# Patient Record
Sex: Male | Born: 1970 | Race: White | Hispanic: No | Marital: Married | Smoking: Never smoker
Health system: Southern US, Community
[De-identification: ages and names within clinical notes are randomized; demographics above are authoritative.]

## PROBLEM LIST (undated history)

## (undated) DIAGNOSIS — K219 Gastro-esophageal reflux disease without esophagitis: Secondary | ICD-10-CM

## (undated) DIAGNOSIS — Z91018 Allergy to other foods: Secondary | ICD-10-CM

## (undated) DIAGNOSIS — I10 Essential (primary) hypertension: Secondary | ICD-10-CM

## (undated) DIAGNOSIS — E785 Hyperlipidemia, unspecified: Secondary | ICD-10-CM

## (undated) DIAGNOSIS — K760 Fatty (change of) liver, not elsewhere classified: Secondary | ICD-10-CM

## (undated) DIAGNOSIS — K602 Anal fissure, unspecified: Secondary | ICD-10-CM

## (undated) DIAGNOSIS — N200 Calculus of kidney: Secondary | ICD-10-CM

## (undated) HISTORY — DX: Gastro-esophageal reflux disease without esophagitis: K21.9

## (undated) HISTORY — PX: OTHER SURGICAL HISTORY: SHX169

## (undated) HISTORY — DX: Allergy to other foods: Z91.018

## (undated) HISTORY — DX: Morbid (severe) obesity due to excess calories: E66.01

## (undated) HISTORY — PX: CLOSED REDUCTION WRIST FRACTURE: SHX1091

## (undated) HISTORY — DX: Fatty (change of) liver, not elsewhere classified: K76.0

## (undated) HISTORY — DX: Anal fissure, unspecified: K60.2

## (undated) HISTORY — DX: Essential (primary) hypertension: I10

## (undated) HISTORY — DX: Hyperlipidemia, unspecified: E78.5

## (undated) HISTORY — DX: Calculus of kidney: N20.0

---

## 1989-06-01 HISTORY — PX: TONSILLECTOMY: SUR1361

## 2005-10-31 ENCOUNTER — Encounter (INDEPENDENT_AMBULATORY_CARE_PROVIDER_SITE_OTHER): Payer: Self-pay | Admitting: *Deleted

## 2006-07-28 ENCOUNTER — Encounter: Payer: Self-pay | Admitting: Cardiology

## 2008-06-07 ENCOUNTER — Ambulatory Visit: Payer: Self-pay | Admitting: *Deleted

## 2008-06-07 ENCOUNTER — Encounter: Payer: Self-pay | Admitting: Internal Medicine

## 2008-06-07 DIAGNOSIS — I10 Essential (primary) hypertension: Secondary | ICD-10-CM | POA: Insufficient documentation

## 2008-06-07 DIAGNOSIS — N200 Calculus of kidney: Secondary | ICD-10-CM | POA: Insufficient documentation

## 2008-06-07 DIAGNOSIS — B356 Tinea cruris: Secondary | ICD-10-CM | POA: Insufficient documentation

## 2008-06-07 DIAGNOSIS — K219 Gastro-esophageal reflux disease without esophagitis: Secondary | ICD-10-CM | POA: Insufficient documentation

## 2008-06-07 DIAGNOSIS — E669 Obesity, unspecified: Secondary | ICD-10-CM | POA: Insufficient documentation

## 2008-06-07 LAB — CONVERTED CEMR LAB
ALT: 41 units/L (ref 0–53)
AST: 31 units/L (ref 0–37)
Albumin: 4.3 g/dL (ref 3.5–5.2)
Alkaline Phosphatase: 68 units/L (ref 39–117)
BUN: 13 mg/dL (ref 6–23)
Basophils Absolute: 0 10*3/uL (ref 0.0–0.1)
Basophils Relative: 0.6 % (ref 0.0–3.0)
CO2: 30 meq/L (ref 19–32)
Calcium: 9.4 mg/dL (ref 8.4–10.5)
Chloride: 101 meq/L (ref 96–112)
Cholesterol: 176 mg/dL (ref 0–200)
Creatinine, Ser: 0.9 mg/dL (ref 0.4–1.5)
Eosinophils Absolute: 0.1 10*3/uL (ref 0.0–0.7)
Eosinophils Relative: 1.1 % (ref 0.0–5.0)
GFR calc Af Amer: 121 mL/min
GFR calc non Af Amer: 100 mL/min
Glucose, Bld: 93 mg/dL (ref 70–99)
HCT: 47.1 % (ref 39.0–52.0)
HDL: 34 mg/dL — ABNORMAL LOW (ref >39.0)
Hemoglobin: 16.1 g/dL (ref 13.0–17.0)
LDL Cholesterol: 118 mg/dL — ABNORMAL HIGH (ref 0–99)
Lymphocytes Relative: 30.7 % (ref 12.0–46.0)
MCHC: 34.1 g/dL (ref 30.0–36.0)
MCV: 86.4 fL (ref 78.0–100.0)
Monocytes Absolute: 0.4 10*3/uL (ref 0.1–1.0)
Monocytes Relative: 5.9 % (ref 3.0–12.0)
Neutro Abs: 4.1 10*3/uL (ref 1.4–7.7)
Neutrophils Relative %: 61.7 % (ref 43.0–77.0)
Platelets: 226 10*3/uL (ref 150–400)
Potassium: 3.8 meq/L (ref 3.5–5.1)
RBC: 5.45 M/uL (ref 4.22–5.81)
RDW: 13.2 % (ref 11.5–14.6)
Sodium: 138 meq/L (ref 135–145)
TSH: 1.54 microintl units/mL (ref 0.35–5.50)
Total Bilirubin: 1.1 mg/dL (ref 0.3–1.2)
Total CHOL/HDL Ratio: 5.2
Total Protein: 8 g/dL (ref 6.0–8.3)
Triglycerides: 118 mg/dL (ref 0–149)
VLDL: 24 mg/dL (ref 0–40)
WBC: 6.7 10*3/uL (ref 4.5–10.5)

## 2008-07-11 ENCOUNTER — Ambulatory Visit: Payer: Self-pay | Admitting: *Deleted

## 2008-07-11 DIAGNOSIS — E785 Hyperlipidemia, unspecified: Secondary | ICD-10-CM | POA: Insufficient documentation

## 2008-07-11 DIAGNOSIS — E876 Hypokalemia: Secondary | ICD-10-CM | POA: Insufficient documentation

## 2008-07-11 LAB — CONVERTED CEMR LAB
BUN: 18 mg/dL (ref 6–23)
CO2: 31 meq/L (ref 19–32)
Calcium: 9.6 mg/dL (ref 8.4–10.5)
Chloride: 104 meq/L (ref 96–112)
Creatinine, Ser: 1.2 mg/dL (ref 0.4–1.5)
GFR calc Af Amer: 87 mL/min
GFR calc non Af Amer: 72 mL/min
Glucose, Bld: 109 mg/dL — ABNORMAL HIGH (ref 70–99)
Potassium: 3.4 meq/L — ABNORMAL LOW (ref 3.5–5.1)
Sodium: 138 meq/L (ref 135–145)

## 2008-07-16 ENCOUNTER — Ambulatory Visit: Payer: Self-pay | Admitting: *Deleted

## 2008-07-16 DIAGNOSIS — E739 Lactose intolerance, unspecified: Secondary | ICD-10-CM | POA: Insufficient documentation

## 2008-07-16 LAB — CONVERTED CEMR LAB
Bilirubin Urine: NEGATIVE
Blood in Urine, dipstick: NEGATIVE
Glucose, Urine, Semiquant: NEGATIVE
Ketones, urine, test strip: NEGATIVE
Nitrite: NEGATIVE
Protein, U semiquant: NEGATIVE
Specific Gravity, Urine: <1.005
Urobilinogen, UA: 0.2
WBC Urine, dipstick: NEGATIVE
pH: 6

## 2008-09-14 ENCOUNTER — Ambulatory Visit: Payer: Self-pay | Admitting: Internal Medicine

## 2008-09-14 DIAGNOSIS — J069 Acute upper respiratory infection, unspecified: Secondary | ICD-10-CM | POA: Insufficient documentation

## 2008-12-28 ENCOUNTER — Ambulatory Visit: Payer: Self-pay | Admitting: Internal Medicine

## 2008-12-31 ENCOUNTER — Telehealth: Payer: Self-pay | Admitting: Internal Medicine

## 2008-12-31 ENCOUNTER — Ambulatory Visit: Payer: Self-pay | Admitting: Internal Medicine

## 2008-12-31 DIAGNOSIS — K602 Anal fissure, unspecified: Secondary | ICD-10-CM | POA: Insufficient documentation

## 2008-12-31 HISTORY — DX: Anal fissure, unspecified: K60.2

## 2009-01-23 LAB — CONVERTED CEMR LAB
BUN: 18 mg/dL (ref 6–23)
CO2: 24 meq/L (ref 19–32)
Calcium: 9 mg/dL (ref 8.4–10.5)
Chloride: 106 meq/L (ref 96–112)
Creatinine, Ser: 0.95 mg/dL (ref 0.40–1.50)
Glucose, Bld: 93 mg/dL (ref 70–99)
Hgb A1c MFr Bld: 5.1 % (ref 4.6–6.1)
Potassium: 4.1 meq/L (ref 3.5–5.3)
Sodium: 141 meq/L (ref 135–145)

## 2009-03-24 ENCOUNTER — Emergency Department (HOSPITAL_COMMUNITY): Admission: EM | Admit: 2009-03-24 | Discharge: 2009-03-24 | Payer: Self-pay | Admitting: Emergency Medicine

## 2009-03-25 ENCOUNTER — Ambulatory Visit: Payer: Self-pay | Admitting: Internal Medicine

## 2009-03-25 LAB — CONVERTED CEMR LAB
Free T4: 1.06 ng/dL (ref 0.80–1.80)
TSH: 1.432 microintl units/mL (ref 0.350–4.500)

## 2009-03-26 ENCOUNTER — Telehealth: Payer: Self-pay | Admitting: Internal Medicine

## 2009-04-17 ENCOUNTER — Encounter: Payer: Self-pay | Admitting: Cardiology

## 2009-04-17 ENCOUNTER — Ambulatory Visit: Payer: Self-pay | Admitting: Cardiology

## 2009-05-03 ENCOUNTER — Ambulatory Visit: Payer: Self-pay | Admitting: Family

## 2009-05-03 ENCOUNTER — Telehealth: Payer: Self-pay | Admitting: Family

## 2009-05-03 ENCOUNTER — Encounter: Payer: Self-pay | Admitting: Internal Medicine

## 2009-05-03 DIAGNOSIS — M545 Low back pain, unspecified: Secondary | ICD-10-CM | POA: Insufficient documentation

## 2009-05-03 LAB — CONVERTED CEMR LAB
Basophils Absolute: 0 10*3/uL (ref 0.0–0.1)
Basophils Relative: 1 % (ref 0–1)
Bilirubin Urine: NEGATIVE
Blood in Urine, dipstick: NEGATIVE
Eosinophils Absolute: 0.1 10*3/uL (ref 0.0–0.7)
Eosinophils Relative: 1 % (ref 0–5)
Glucose, Urine, Semiquant: NEGATIVE
HCT: 45.8 % (ref 39.0–52.0)
Hemoglobin: 15.8 g/dL (ref 13.0–17.0)
Ketones, urine, test strip: NEGATIVE
Lymphocytes Relative: 33 % (ref 12–46)
Lymphs Abs: 2.7 10*3/uL (ref 0.7–4.0)
MCHC: 34.5 g/dL (ref 30.0–36.0)
MCV: 83.9 fL (ref 78.0–100.0)
Monocytes Absolute: 0.6 10*3/uL (ref 0.1–1.0)
Monocytes Relative: 8 % (ref 3–12)
Neutro Abs: 4.8 10*3/uL (ref 1.7–7.7)
Neutrophils Relative %: 58 % (ref 43–77)
Nitrite: NEGATIVE
Platelets: 254 10*3/uL (ref 150–400)
RBC: 5.46 M/uL (ref 4.22–5.81)
RDW: 13.4 % (ref 11.5–15.5)
Specific Gravity, Urine: >=1.03
Urobilinogen, UA: 0.2
WBC Urine, dipstick: NEGATIVE
WBC: 8.2 10*3/uL (ref 4.0–10.5)
pH: 5

## 2009-05-09 ENCOUNTER — Ambulatory Visit: Payer: Self-pay | Admitting: Internal Medicine

## 2009-08-04 ENCOUNTER — Emergency Department (HOSPITAL_COMMUNITY): Admission: EM | Admit: 2009-08-04 | Discharge: 2009-08-04 | Payer: Self-pay | Admitting: Family Medicine

## 2009-08-07 ENCOUNTER — Encounter (INDEPENDENT_AMBULATORY_CARE_PROVIDER_SITE_OTHER): Payer: Self-pay | Admitting: *Deleted

## 2009-08-07 ENCOUNTER — Ambulatory Visit: Payer: Self-pay | Admitting: Internal Medicine

## 2009-08-07 DIAGNOSIS — J189 Pneumonia, unspecified organism: Secondary | ICD-10-CM | POA: Insufficient documentation

## 2009-08-14 ENCOUNTER — Ambulatory Visit: Payer: Self-pay | Admitting: Internal Medicine

## 2009-08-14 DIAGNOSIS — J329 Chronic sinusitis, unspecified: Secondary | ICD-10-CM | POA: Insufficient documentation

## 2009-08-14 LAB — CONVERTED CEMR LAB
ALT: 58 units/L — ABNORMAL HIGH (ref 0–53)
AST: 28 units/L (ref 0–37)
Albumin: 3.6 g/dL (ref 3.5–5.2)
Alkaline Phosphatase: 57 units/L (ref 39–117)
BUN: 13 mg/dL (ref 6–23)
Basophils Absolute: 0.1 10*3/uL (ref 0.0–0.1)
Basophils Relative: 1.1 % (ref 0.0–3.0)
Bilirubin Urine: NEGATIVE
Bilirubin, Direct: 0.2 mg/dL (ref 0.0–0.3)
CO2: 33 meq/L — ABNORMAL HIGH (ref 19–32)
Calcium: 8.6 mg/dL (ref 8.4–10.5)
Chloride: 102 meq/L (ref 96–112)
Cholesterol: 152 mg/dL (ref 0–200)
Creatinine, Ser: 1 mg/dL (ref 0.4–1.5)
Eosinophils Absolute: 0 10*3/uL (ref 0.0–0.7)
Eosinophils Relative: 0.7 % (ref 0.0–5.0)
GFR calc non Af Amer: 87.56 mL/min (ref >60)
Glucose, Bld: 91 mg/dL (ref 70–99)
HCT: 44 % (ref 39.0–52.0)
HDL: 37.9 mg/dL — ABNORMAL LOW (ref >39.00)
Hemoglobin: 14.9 g/dL (ref 13.0–17.0)
Ketones, ur: NEGATIVE mg/dL
LDL Cholesterol: 83 mg/dL (ref 0–99)
Leukocytes, UA: NEGATIVE
Lymphocytes Relative: 45.1 % (ref 12.0–46.0)
Lymphs Abs: 3.1 10*3/uL (ref 0.7–4.0)
MCHC: 33.9 g/dL (ref 30.0–36.0)
MCV: 86.2 fL (ref 78.0–100.0)
Monocytes Absolute: 0.6 10*3/uL (ref 0.1–1.0)
Monocytes Relative: 9 % (ref 3.0–12.0)
Neutro Abs: 3 10*3/uL (ref 1.4–7.7)
Neutrophils Relative %: 44.1 % (ref 43.0–77.0)
Nitrite: NEGATIVE
PSA: 0.54 ng/mL (ref 0.10–4.00)
Platelets: 275 10*3/uL (ref 150.0–400.0)
Potassium: 3.3 meq/L — ABNORMAL LOW (ref 3.5–5.1)
RBC: 5.11 M/uL (ref 4.22–5.81)
RDW: 13.8 % (ref 11.5–14.6)
Sodium: 139 meq/L (ref 135–145)
Specific Gravity, Urine: 1.025 (ref 1.000–1.030)
TSH: 1.24 microintl units/mL (ref 0.35–5.50)
Total Bilirubin: 0.9 mg/dL (ref 0.3–1.2)
Total CHOL/HDL Ratio: 4
Total Protein, Urine: NEGATIVE mg/dL
Total Protein: 6.8 g/dL (ref 6.0–8.3)
Triglycerides: 154 mg/dL — ABNORMAL HIGH (ref 0.0–149.0)
Urine Glucose: NEGATIVE mg/dL
Urobilinogen, UA: 0.2 (ref 0.0–1.0)
VLDL: 30.8 mg/dL (ref 0.0–40.0)
WBC: 6.8 10*3/uL (ref 4.5–10.5)
pH: 6.5 (ref 5.0–8.0)

## 2009-08-19 ENCOUNTER — Ambulatory Visit: Payer: Self-pay | Admitting: Internal Medicine

## 2009-09-27 ENCOUNTER — Ambulatory Visit: Payer: Self-pay | Admitting: Internal Medicine

## 2009-09-30 ENCOUNTER — Encounter (INDEPENDENT_AMBULATORY_CARE_PROVIDER_SITE_OTHER): Payer: Self-pay | Admitting: *Deleted

## 2009-09-30 ENCOUNTER — Telehealth: Payer: Self-pay | Admitting: Internal Medicine

## 2009-09-30 DIAGNOSIS — G4710 Hypersomnia, unspecified: Secondary | ICD-10-CM | POA: Insufficient documentation

## 2009-10-14 ENCOUNTER — Ambulatory Visit: Payer: Self-pay | Admitting: Pulmonary Disease

## 2009-10-14 DIAGNOSIS — G2581 Restless legs syndrome: Secondary | ICD-10-CM | POA: Insufficient documentation

## 2009-10-23 ENCOUNTER — Telehealth: Payer: Self-pay | Admitting: Internal Medicine

## 2009-10-25 ENCOUNTER — Encounter: Payer: Self-pay | Admitting: Internal Medicine

## 2009-11-18 ENCOUNTER — Ambulatory Visit (HOSPITAL_COMMUNITY): Admission: RE | Admit: 2009-11-18 | Discharge: 2009-11-18 | Payer: Self-pay | Admitting: Surgery

## 2009-11-18 ENCOUNTER — Ambulatory Visit: Payer: Self-pay | Admitting: Internal Medicine

## 2009-11-19 ENCOUNTER — Ambulatory Visit (HOSPITAL_BASED_OUTPATIENT_CLINIC_OR_DEPARTMENT_OTHER): Admission: RE | Admit: 2009-11-19 | Discharge: 2009-11-19 | Payer: Self-pay | Admitting: Pulmonary Disease

## 2009-11-19 ENCOUNTER — Ambulatory Visit: Payer: Self-pay | Admitting: Pulmonary Disease

## 2009-11-20 ENCOUNTER — Ambulatory Visit (HOSPITAL_COMMUNITY): Admission: RE | Admit: 2009-11-20 | Discharge: 2009-11-20 | Payer: Self-pay | Admitting: Surgery

## 2009-11-25 ENCOUNTER — Encounter: Payer: Self-pay | Admitting: Internal Medicine

## 2009-12-17 ENCOUNTER — Encounter: Admission: RE | Admit: 2009-12-17 | Discharge: 2010-03-17 | Payer: Self-pay | Admitting: Surgery

## 2010-01-02 ENCOUNTER — Telehealth: Payer: Self-pay | Admitting: Pulmonary Disease

## 2010-01-28 ENCOUNTER — Ambulatory Visit: Payer: Self-pay | Admitting: Internal Medicine

## 2010-01-28 LAB — CONVERTED CEMR LAB
Bilirubin Urine: NEGATIVE
Glucose, Urine, Semiquant: NEGATIVE
Ketones, urine, test strip: NEGATIVE
Nitrite: NEGATIVE
Protein, U semiquant: NEGATIVE
Specific Gravity, Urine: 1.015
Urobilinogen, UA: 0.2
WBC Urine, dipstick: NEGATIVE
pH: 5

## 2010-02-07 ENCOUNTER — Encounter: Payer: Self-pay | Admitting: Internal Medicine

## 2010-02-07 ENCOUNTER — Ambulatory Visit (HOSPITAL_BASED_OUTPATIENT_CLINIC_OR_DEPARTMENT_OTHER): Admission: RE | Admit: 2010-02-07 | Discharge: 2010-02-07 | Payer: Self-pay | Admitting: Urology

## 2010-02-07 ENCOUNTER — Emergency Department (HOSPITAL_BASED_OUTPATIENT_CLINIC_OR_DEPARTMENT_OTHER): Admission: EM | Admit: 2010-02-07 | Discharge: 2010-02-07 | Payer: Self-pay | Admitting: Emergency Medicine

## 2010-02-07 ENCOUNTER — Ambulatory Visit: Payer: Self-pay | Admitting: Diagnostic Radiology

## 2010-02-13 ENCOUNTER — Ambulatory Visit (HOSPITAL_COMMUNITY): Admission: RE | Admit: 2010-02-13 | Discharge: 2010-02-13 | Payer: Self-pay | Admitting: Urology

## 2010-02-18 ENCOUNTER — Encounter: Payer: Self-pay | Admitting: Internal Medicine

## 2010-03-03 ENCOUNTER — Ambulatory Visit: Payer: Self-pay | Admitting: Internal Medicine

## 2010-03-06 ENCOUNTER — Encounter: Payer: Self-pay | Admitting: Internal Medicine

## 2010-03-14 ENCOUNTER — Telehealth (INDEPENDENT_AMBULATORY_CARE_PROVIDER_SITE_OTHER): Payer: Self-pay | Admitting: *Deleted

## 2010-03-14 ENCOUNTER — Telehealth: Payer: Self-pay | Admitting: Pulmonary Disease

## 2010-03-14 DIAGNOSIS — G4733 Obstructive sleep apnea (adult) (pediatric): Secondary | ICD-10-CM | POA: Insufficient documentation

## 2010-03-17 ENCOUNTER — Encounter: Payer: Self-pay | Admitting: Pulmonary Disease

## 2010-03-18 ENCOUNTER — Ambulatory Visit (HOSPITAL_COMMUNITY): Admission: RE | Admit: 2010-03-18 | Discharge: 2010-03-19 | Payer: Self-pay | Admitting: Surgery

## 2010-03-18 HISTORY — PX: LAPAROSCOPIC GASTRIC BANDING: SHX1100

## 2010-04-01 ENCOUNTER — Encounter
Admission: RE | Admit: 2010-04-01 | Discharge: 2010-06-30 | Payer: Self-pay | Source: Home / Self Care | Attending: Surgery | Admitting: Surgery

## 2010-04-01 ENCOUNTER — Telehealth (INDEPENDENT_AMBULATORY_CARE_PROVIDER_SITE_OTHER): Payer: Self-pay | Admitting: *Deleted

## 2010-04-09 ENCOUNTER — Encounter: Payer: Self-pay | Admitting: Pulmonary Disease

## 2010-04-10 ENCOUNTER — Encounter: Payer: Self-pay | Admitting: Internal Medicine

## 2010-05-12 ENCOUNTER — Encounter
Admission: RE | Admit: 2010-05-12 | Discharge: 2010-07-01 | Payer: Self-pay | Source: Home / Self Care | Attending: Surgery | Admitting: Surgery

## 2010-05-12 ENCOUNTER — Telehealth: Payer: Self-pay | Admitting: Internal Medicine

## 2010-05-12 ENCOUNTER — Ambulatory Visit: Payer: Self-pay | Admitting: Internal Medicine

## 2010-05-15 ENCOUNTER — Encounter: Payer: Self-pay | Admitting: Internal Medicine

## 2010-06-18 ENCOUNTER — Encounter: Payer: Self-pay | Admitting: Internal Medicine

## 2010-06-27 ENCOUNTER — Emergency Department (HOSPITAL_BASED_OUTPATIENT_CLINIC_OR_DEPARTMENT_OTHER)
Admission: EM | Admit: 2010-06-27 | Discharge: 2010-06-27 | Payer: Self-pay | Source: Home / Self Care | Admitting: Emergency Medicine

## 2010-07-01 NOTE — Assessment & Plan Note (Signed)
Summary: [facility] PAIN/CD   Vital Signs:  Patient profile:   40 year old male Height:      64 inches (162.56 cm) Weight:      271.8 pounds (123.55 kg) O2 Sat:      95 % on Room air Temp:     99.9 degrees F (37.72 degrees C) oral Pulse rate:   111 / minute BP sitting:   142 / 98  (left arm) Cuff size:   large  Vitals Entered By: Orlan Leavens RMA (January 28, 2010 3:24 PM)  O2 Flow:  Room air CC: Flank pain & dark urine Is Patient Diabetic? No Pain Assessment Patient in pain? yes     Location: Flank pain Type: aching Comments Pt states 2 weeks ago started having (L) back pain ? kidney stone because he  states he felt it when it pass. As of yesterday urine has been very dark still having flank pain which pain goes around to his testicles.   Primary Care Provider:  Newt Lukes MD  CC:  Flank pain & dark urine.  History of Present Illness: c/o right flank pain onset 2 weeks ago - course has been progressive -  hx same related to kidney stone in past - has required lithotripsy and basket ret and JJ stent in past a/w dark cola colored urine - lighten and darken over the last 2 days - no gross hematuria or clots pain radiates into right testicle no dysuria but stop/start flow -   also f/u obesity -  s/p eval and clearance for bariatric surg EKG, barium swallow, nutritionist, [place] and sleep study all complete -- surg sched for 03/18/10  Current Medications (verified): 1)  Triamterene-Hctz 37.5-25 Mg Caps (Triamterene-Hctz) .... One Tab By Mouth Once Daily 2)  Nexium 40 Mg Cpdr (Esomeprazole Magnesium) .... One By Mouth Once Daily 30 Mins Before Meals 3)  Multivitamins  Tabs (Multiple Vitamin) .... Take 1 Tablet By Mouth Once A Day 4)  Fluticasone Propionate 50 Mcg/act Susp (Fluticasone Propionate) .Marland Kitchen.. 1 Spray Each Nostril  Every Morning 5)  Valacyclovir Hcl 1 Gm Tabs (Valacyclovir Hcl) .Marland Kitchen.. 1 By Mouth X 1 As Needed For Cold Sores - Repeat Daily If Needed 6)   Loratadine 10 Mg Tabs (Loratadine) .Marland Kitchen.. 1 By Mouth Once Daily  Allergies (verified): 1)  ! * Diaulad 2)  ! * Ivp Dye 3)  ! * Cats and Dogs 4)  ! * Shellfish 5)  ! * Peanuts  Past History:  Past Medical History: HYPERTENSION MORBID OBESITY GERD ANAL FISSURE  GLUCOSE INTOLERANCE, hx of NEPHROLITHIASIS, hx of  MD roster:   pulm - [facility] -   Review of Systems  The patient denies abdominal pain, incontinence, genital sores, abnormal bleeding, and enlarged lymph nodes.    Physical Exam  General:  alert, well-developed, well-nourished, and cooperative to examination. nontoxic, overweight-appearing.   Lungs:  normal respiratory effort, no intercostal retractions or use of accessory muscles; normal breath sounds bilaterally - no crackles and no wheezes.    Heart:  normal rate, regular rhythm, no murmur, and no rub. BLE without edema. Genitalia:  Testes bilaterally descended without nodularity, tenderness or masses. No scrotal masses or lesions. No penis lesions or urethral discharge.   Impression & Recommendations:  Problem # 1:  NEPHROLITHIASIS (ICD-592.0)  recurrent with hx same requiring intervention by uro surg - will refer for est with local uro now- treat with cipro awaiting Ucx given LGF - also pain control with  hydrocodone -  hydrate, strain  Orders: Urology Referral (Urology)  Problem # 2:  LOW BACK PAIN, ACUTE (ICD-724.2) see above - likely kidney stone, no neuro or mskel deficits His updated medication list for this problem includes:    Hydrocodone-acetaminophen 5-500 Mg Tabs (Hydrocodone-acetaminophen) .Marland Kitchen... 1 by mouth every 6 hours as needed for pain  Orders: UA Dipstick w/o Micro (manual) (16109) T-Culture, Urine (60454-09811)  Problem # 3:  MORBID OBESITY (ICD-278.01)  process towards lap band reviewed - sched 03/18/10 encouraged to continue exercise efforts (walking) as tolerated and diet   Ht: 64 (11/18/2009)   Wt: 267.4 (11/18/2009)   BMI: 43.41  (10/14/2009)  Ht: 64 (01/28/2010)   Wt: 271.8 (01/28/2010)   BMI: 43.41 (10/14/2009) abd [place] reviewed - +right kidney stones on 12/20/09  Complete Medication List: 1)  Triamterene-hctz 37.5-25 Mg Caps (Triamterene-hctz) .... One tab by mouth once daily 2)  Nexium 40 Mg Cpdr (Esomeprazole magnesium) .... One by mouth once daily 30 mins before meals 3)  Multivitamins Tabs (Multiple vitamin) .... Take 1 tablet by mouth once a day 4)  Fluticasone Propionate 50 Mcg/act Susp (Fluticasone propionate) .Marland Kitchen.. 1 spray each nostril  every morning 5)  Valacyclovir Hcl 1 Gm Tabs (Valacyclovir hcl) .Marland Kitchen.. 1 by mouth x 1 as needed for cold sores - repeat daily if needed 6)  Loratadine 10 Mg Tabs (Loratadine) .Marland Kitchen.. 1 by mouth once daily 7)  Ciprofloxacin Hcl 500 Mg Tabs (Ciprofloxacin hcl) .Marland Kitchen.. 1 by mouth two times a day x 7 days 8)  Hydrocodone-acetaminophen 5-500 Mg Tabs (Hydrocodone-acetaminophen) .Marland Kitchen.. 1 by mouth every 6 hours as needed for pain  Patient Instructions: 1)  it was good to see you today. 2)  cipro for possible infection and hydrocodone to help with kidney stone pain as discussed 3)  stay hydrated!! 4)  we'll make referral to urology for evaluation and treatment. Our office will contact you regarding this appointment once made.  5)  good luck with bariatric surgery as planned! 6)  Please schedule a follow-up appointment as needed. Prescriptions: HYDROCODONE-ACETAMINOPHEN 5-500 MG TABS (HYDROCODONE-ACETAMINOPHEN) 1 by mouth every 6 hours as needed for pain  #20 x 1   Entered and Authorized by:   Newt Lukes MD   Signed by:   Newt Lukes MD on 01/28/2010   Method used:   Print then Give to Patient   RxID:   [id number] CIPROFLOXACIN HCL 500 MG TABS (CIPROFLOXACIN HCL) 1 by mouth two times a day x 7 days  #14 x 0   Entered and Authorized by:   Newt Lukes MD   Signed by:   Newt Lukes MD on 01/28/2010   Method used:   Print then Give to Patient   RxID:    [id number]   Laboratory Results   Urine Tests    Routine Urinalysis   Color: brown/ light Appearance: Hazy Glucose: negative   (Normal Range: Negative) Bilirubin: negative   (Normal Range: Negative) Ketone: negative   (Normal Range: Negative) Spec. Gravity: 1.015   (Normal Range: 1.003-1.035) Blood: large   (Normal Range: Negative) pH: 5.0   (Normal Range: 5.0-8.0) Protein: negative   (Normal Range: Negative) Urobilinogen: 0.2   (Normal Range: 0-1) Nitrite: negative   (Normal Range: Negative) Leukocyte Esterace: negative   (Normal Range: Negative)

## 2010-07-01 NOTE — Progress Notes (Signed)
  Phone Note Outgoing Call   Call placed by: Coralyn Helling MD,  March 14, 2010 12:08 PM Call placed to: Patient Summary of Call: Contact patient about sleep test results.  Explained to him that he has severe OSA and needs to be set up with CPAP.  He was unable to follow up earlier because he was hospitalized with kidney stone.  He is also now set up for lap band surgery next week.  Will arrange for auto-CPAP for now.  Will have my nurse call him to schedule ROV in 2 months after CPAP set up.  Advised him to inform his surgeon and anesthesiologist that he has sleep apnea and that he needs to wear CPAP during the post-operative period after his lap band surgery. Initial call taken by: Coralyn Helling MD,  March 14, 2010 12:10 PM  New Problems: OBSTRUCTIVE SLEEP APNEA (ICD-327.23)   New Problems: OBSTRUCTIVE SLEEP APNEA (ICD-327.23)

## 2010-07-01 NOTE — Assessment & Plan Note (Signed)
Summary: DISCUSS LAP BAND SURGERY/#/CD   Vital Signs:  Patient profile:   40 year old male Height:      64 inches (670.56 cm) Weight:      264 pounds (44.91 kg) BMI:     45.48 O2 Sat:      95 % on Room air Temp:     98.8 degrees F (37.11 degrees C) oral Pulse rate:   105 / minute BP sitting:   130 / 86  (left arm) Cuff size:   large  Vitals Entered By: Orlan Leavens (September 27, 2009 3:44 PM)  O2 Flow:  Room air CC: Discuss lap band surgery Is Patient Diabetic? No Pain Assessment Patient in pain? no        Primary Care Provider:  Newt Lukes MD  CC:  Discuss lap band surgery.  History of Present Illness: obesity -  wants to discuss possible lap band has been to bariatric meeting and begun collection of paperwork and records to proceed with same  Clinical Review Panels:  Immunizations   Last Tetanus Booster:  Historical (01/29/2006)   Last Flu Vaccine:  Historical (03/04/2009)  Lipid Management   Cholesterol:  152 (08/14/2009)   LDL (bad choesterol):  83 (08/14/2009)   HDL (good cholesterol):  37.90 (08/14/2009)  CBC   WBC:  6.8 (08/14/2009)   RBC:  5.11 (08/14/2009)   Hgb:  14.9 (08/14/2009)   Hct:  44.0 (08/14/2009)   Platelets:  275.0 (08/14/2009)   MCV  86.2 (08/14/2009)   MCHC  33.9 (08/14/2009)   RDW  13.8 (08/14/2009)   PMN:  44.1 (08/14/2009)   Lymphs:  45.1 (08/14/2009)   Monos:  9.0 (08/14/2009)   Eosinophils:  0.7 (08/14/2009)   Basophil:  1.1 (08/14/2009)  Complete Metabolic Panel   Glucose:  91 (08/14/2009)   Sodium:  139 (08/14/2009)   Potassium:  3.3 (08/14/2009)   Chloride:  102 (08/14/2009)   CO2:  33 (08/14/2009)   BUN:  13 (08/14/2009)   Creatinine:  1.0 (08/14/2009)   Albumin:  3.6 (08/14/2009)   Total Protein:  6.8 (08/14/2009)   Calcium:  8.6 (08/14/2009)   Total Bili:  0.9 (08/14/2009)   Alk Phos:  57 (08/14/2009)   SGPT (ALT):  58 (08/14/2009)   SGOT (AST):  28 (08/14/2009)   Current Medications (verified): 1)   Triamterene-Hctz 37.5-25 Mg Caps (Triamterene-Hctz) .... One Tab By Mouth Once Daily 2)  Nexium 40 Mg Cpdr (Esomeprazole Magnesium) .... One By Mouth Once Daily 30 Mins Before Meals 3)  Multivitamins  Tabs (Multiple Vitamin) .... Take 1 Tablet By Mouth Once A Day 4)  Fluticasone Propionate 50 Mcg/act Susp (Fluticasone Propionate) .Marland Kitchen.. 1 Spray Each Nostril  Every Morning  Allergies (verified): 1)  ! * Diaulad 2)  ! * Ivp Dye 3)  ! * Cats and Dogs 4)  ! * Shellfish 5)  ! * Peanuts  Past History:  Past medical, surgical, family and social histories (including risk factors) reviewed, and no changes noted (except as noted below).  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 Tonsillectomy 1991 closed wrist reduction age 85       Family History: Reviewed history from 05/09/2009 and no changes required. Family History of CAD Male 1st degree relative <50 Family History Hypertension Family History of Prostate CA 1st degree relative <50 Family History  of Stroke M 1st degree relative <50    mom expired age 59y - CAD/MI dad expired age 70y - prostate ca, CVA, HTN, A fib, MI x 1 oldest bro expired age 5s - cardiac complications CABG and valve 2nd bro - age 2s s/p CABG age 58s sister - HTN        Social History: Reviewed history from 08/07/2009 and no changes required. 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  The patient denies anorexia, fever, weight loss, chest pain, headaches, and abdominal pain.    Physical Exam  General:  alert, well-developed, well-nourished, and  cooperative to examination.   overweight-appearing.   Lungs:  normal respiratory effort, no intercostal retractions or use of accessory muscles; normal breath sounds bilaterally - no crackles and no wheezes.    Heart:  normal rate, regular rhythm, no murmur, and no rub. BLE without edema. Psych:  Oriented X3, memory intact for recent and remote, normally interactive, good eye contact, not anxious appearing, not depressed appearing, and not agitated.      Impression & Recommendations:  Problem # 1:  MORBID OBESITY (ICD-278.01)  forms to proceed with lap band accepted and reviewed - will complete for Monday pick up encouraged to continue exercise efforts (walking) as tolerated and diet   Problem # 2:  HYPERTENSION (ICD-401.9)  His updated medication list for this problem includes:    Triamterene-hctz 37.5-25 Mg Caps (Triamterene-hctz) ..... One tab by mouth once daily  BP today: 130/86 Prior BP: 146/92 (08/19/2009)  Labs Reviewed: K+: 3.3 (08/14/2009) Creat: : 1.0 (08/14/2009)   Chol: 152 (08/14/2009)   HDL: 37.90 (08/14/2009)   LDL: 83 (08/14/2009)   TG: 154.0 (08/14/2009)  Problem # 3:  HYPERLIPIDEMIA (ICD-272.4)  Labs Reviewed: SGOT: 28 (08/14/2009)   SGPT: 58 (08/14/2009)   HDL:37.90 (08/14/2009), 34.0 (06/07/2008)  LDL:83 (08/14/2009), 118 (16/03/9603)  Chol:152 (08/14/2009), 176 (06/07/2008)  Trig:154.0 (08/14/2009), 118 (06/07/2008)  Problem # 4:  GERD (ICD-530.81)  His updated medication list for this problem includes:    Nexium 40 Mg Cpdr (Esomeprazole magnesium) ..... One by mouth once daily 30 mins before meals  Labs Reviewed: Hgb: 14.9 (08/14/2009)   Hct: 44.0 (08/14/2009)  Problem # 5:  GLUCOSE INTOLERANCE (ICD-271.3) Time spent with patient 25 minutes, more than 50% of this time was spent counseling patient on prior weight loss efforts and form review/completion to proceed with lap band  Complete Medication List: 1)  Triamterene-hctz 37.5-25 Mg Caps  (Triamterene-hctz) .... One tab by mouth once daily 2)  Nexium 40 Mg Cpdr (Esomeprazole magnesium) .... One by mouth once daily 30 mins before meals 3)  Multivitamins Tabs (Multiple vitamin) .... Take 1 tablet by mouth once a day 4)  Fluticasone Propionate 50 Mcg/act Susp (Fluticasone propionate) .Marland Kitchen.. 1 spray each nostril  every morning  Patient Instructions: 1)  it was good to see you today.  2)  will complete form and leter for bariatric clinic to proceed with lap band procedure to help you achieve weight loss as discussed - our office will call you monday for pick up 3)  Please keep follow-up appointment as scheduled, call sooner if problems.

## 2010-07-01 NOTE — Assessment & Plan Note (Signed)
Summary: CONGESTION/ NO FEVER /NWS   Vital Signs:  Patient profile:   40 year old male Height:      66 inches (167.64 cm) Weight:      257.0 pounds (116.82 kg) O2 Sat:      96 % on Room air Temp:     98.7 degrees F (37.06 degrees C) oral Pulse rate:   80 / minute BP sitting:   124 / 90  (left arm) Cuff size:   large  Vitals Entered By: Orlan Leavens (August 14, 2009 10:18 AM)  O2 Flow:  Room air CC: Nasal congestion Is Patient Diabetic? No Pain Assessment Patient in pain? no        Primary Care Provider:  Newt Lukes MD  CC:  Nasal congestion.  History of Present Illness: seen last week 3/9 OV for similar symptoms -  still with cough symptoms  fever gone s/p abx course night symptoms somewhat improved with codiene cough rx but now out no HA, rash or hemopytsis; occ nasal discharge - +nasal congestion  Current Medications (verified): 1)  Triamterene-Hctz 37.5-25 Mg Caps (Triamterene-Hctz) .... One Tab By Mouth Once Daily 2)  Nexium 40 Mg Cpdr (Esomeprazole Magnesium) .... One By Mouth Once Daily 30 Mins Before Meals 3)  Multivitamins  Tabs (Multiple Vitamin) .... Take 1 Tablet By Mouth Once A Day  Allergies (verified): 1)  ! * Diaulad 2)  ! * Ivp Dye 3)  ! * Cats and Dogs 4)  ! * Shellfish 5)  ! * Peanuts  Review of Systems  The patient denies fever, vision loss, decreased hearing, hoarseness, and dyspnea on exertion.    Physical Exam  General:  alert, well-developed, well-nourished, and cooperative to examination.   mildly ill with continous dry coughing Eyes:  vision grossly intact; pupils equal, round and reactive to light.  conjunctiva and lids normal.    Ears:  normal pinnae bilaterally, without erythema, swelling, or tenderness to palpation. TMs clear, without effusion, or cerumen impaction. Hearing grossly normal bilaterally  Mouth:  teeth and gums in good repair; mucous membranes moist, without lesions or ulcers. oropharynx clear without exudate,  mod erythema. PND present Lungs:  normal respiratory effort, no intercostal retractions or use of accessory muscles; normal breath sounds bilaterally - no crackles and no wheezes.    Heart:  normal rate, regular rhythm, no murmur, and no rub. BLE without edema.    Impression & Recommendations:  Problem # 1:  PNEUMONIA, ATYPICAL (ICD-486) Assessment Improved now symptoms more c/w sinusitus or allergic   Problem # 2:  UNSPECIFIED SINUSITIS (ICD-473.9)  The following medications were removed from the medication list:    Cheratussin Ac 100-10 Mg/20ml Syrp (Guaifenesin-codeine) .Marland Kitchen... 1-2 teaspoon q 6 hours as needed    Benzonatate 200 Mg Caps (Benzonatate) .Marland Kitchen... 1 by mouth three times a day x 7days, then as needed for cough His updated medication list for this problem includes:    Fluticasone Propionate 50 Mcg/act Susp (Fluticasone propionate) .Marland Kitchen... 1 spray each nostril  every morning    Cheratussin Ac 100-10 Mg/78ml Syrp (Guaifenesin-codeine) .Marland Kitchen... 5-10cc by mouth every 6hours as needed for cough, esp night    Mucinex D 60-600 Mg Xr12h-tab (Pseudoephedrine-guaifenesin) .Marland Kitchen... 1 by mouth  every morning x 7 days, then as needed for cough+congestion symptoms  Complete Medication List: 1)  Triamterene-hctz 37.5-25 Mg Caps (Triamterene-hctz) .... One tab by mouth once daily 2)  Nexium 40 Mg Cpdr (Esomeprazole magnesium) .... One by mouth once daily 30  mins before meals 3)  Multivitamins Tabs (Multiple vitamin) .... Take 1 tablet by mouth once a day 4)  Fluticasone Propionate 50 Mcg/act Susp (Fluticasone propionate) .Marland Kitchen.. 1 spray each nostril  every morning 5)  Cheratussin Ac 100-10 Mg/55ml Syrp (Guaifenesin-codeine) .... 5-10cc by mouth every 6hours as needed for cough, esp night 6)  Claritin 10 Mg Tabs (Loratadine) .Marland Kitchen.. 1 by mouth once daily x 10days, then as needed for nasal symptoms (this medicine drys the wet) 7)  Mucinex D 60-600 Mg Xr12h-tab (Pseudoephedrine-guaifenesin) .Marland Kitchen.. 1 by mouth  every  morning x 7 days, then as needed for cough+congestion symptoms  Patient Instructions: 1)  it was good to see you today. 2)  meds as listed below to help with your congestion and nasla symptoms - 3)  chest is clear and oxygen/temp normal, but you develop worsening symptoms or fever, call us and we can reconsider antibiotics but it does not appear necessary to use any anitbiotic at this time  Prescriptions: CHERATUSSIN AC 100-10 MG/5ML SYRP (GUAIFENESIN-CODEINE) 5-10cc by mouth every 6hours as needed for cough, esp night  #6 oz x 0   Entered and Authorized by:   Newt Lukes MD   Signed by:   Newt Lukes MD on 08/14/2009   Method used:   Print then Give to Patient   RxID:   [id number] FLUTICASONE PROPIONATE 50 MCG/ACT SUSP (FLUTICASONE PROPIONATE) 1 spray each nostril  every morning  #1 x 3   Entered and Authorized by:   Newt Lukes MD   Signed by:   Newt Lukes MD on 08/14/2009   Method used:   Print then Give to Patient   RxID:   [id number]

## 2010-07-01 NOTE — Assessment & Plan Note (Signed)
Summary: cold symptoms/ok val/new pt appt later this month/cd   Vital Signs:  Patient profile:   40 year old male Height:      66 inches (167.64 cm) Weight:      259.4 pounds (117.91 kg) O2 Sat:      94 % on Room air Temp:     100.3 degrees F (37.94 degrees C) oral Pulse rate:   102 / minute BP sitting:   130 / 92  (left arm) Cuff size:   large  Vitals Entered By: Orlan Leavens (August 07, 2009 1:18 PM)  O2 Flow:  Room air CC: New patient/ Ongoing cold sxs and fever. Pt states he went to [facility] on sat was dx with the flu. since then not been able to break fever. Now having wheezing Is Patient Diabetic? No Pain Assessment Patient in pain? no        Primary Care Provider:  Dondra Spry DO  CC:  New patient/ Ongoing cold sxs and fever. Pt states he went to [facility] on sat was dx with the flu. since then not been able to break fever. Now having wheezing.  History of Present Illness: new pt to me but known to our practice - transfer from dr. Artist Pais -  c/o cough symptoms  associated with fever - onset 5 days ago -  seen at [facility] 4 days ago - dx with flu and given cough med with codiene - still with chest congestion - esp worse at night  somewhat improved with codiene cough rx but not helping fever progressed with wheeze on left side and mild pleuritic pain if unexpected cough - no HA, rash or hemopytsis; no nasal discharge -  Preventive Screening-Counseling & Management  Alcohol-Tobacco     Alcohol drinks/day: <1     Alcohol type: mixed drink, wine     Smoking Status: never     Passive Smoke Exposure: no  Caffeine-Diet-Exercise     Caffeine use/day: 2     Does Patient Exercise: no  Clinical Review Panels:  Immunizations   Last Tetanus Booster:  Historical (01/29/2006)   Last Flu Vaccine:  Historical (03/04/2009)  Lipid Management   Cholesterol:  176 (06/07/2008)   LDL (bad choesterol):  118 (06/07/2008)   HDL (good cholesterol):   09.8 (06/07/2008)  CBC   WBC:  8.2 (05/03/2009)   RBC:  5.46 (05/03/2009)   Hgb:  15.8 (05/03/2009)   Hct:  45.8 (05/03/2009)   Platelets:  254 (05/03/2009)   MCV  83.9 (05/03/2009)   MCHC  34.5 (05/03/2009)   RDW  13.4 (05/03/2009)   PMN:  58 (05/03/2009)   Lymphs:  33 (05/03/2009)   Monos:  8 (05/03/2009)   Eosinophils:  1 (05/03/2009)   Basophil:  1 (05/03/2009)  Complete Metabolic Panel   Glucose:  93 (12/28/2008)   Sodium:  141 (12/28/2008)   Potassium:  4.1 (12/28/2008)   Chloride:  106 (12/28/2008)   CO2:  24 (12/28/2008)   BUN:  18 (12/28/2008)   Creatinine:  0.95 (12/28/2008)   Albumin:  4.3 (06/07/2008)   Total Protein:  8.0 (06/07/2008)   Calcium:  9.0 (12/28/2008)   Total Bili:  1.1 (06/07/2008)   Alk Phos:  68 (06/07/2008)   SGPT (ALT):  41 (06/07/2008)   SGOT (AST):  31 (06/07/2008)   Current Medications (verified): 1)  Triamterene-Hctz 37.5-25 Mg Caps (Triamterene-Hctz) .... One Tab By Mouth Once Daily 2)  Nexium 40 Mg Cpdr (Esomeprazole  Magnesium) .... One By Mouth Once Daily 30 Mins Before Meals 3)  Multivitamins  Tabs (Multiple Vitamin) .... Take 1 Tablet By Mouth Once A Day 4)  Cheratussin Ac 100-10 Mg/2ml Syrp (Guaifenesin-Codeine) .Marland Kitchen.. 1-2 Teaspoon Q 6 Hours As Needed  Allergies (verified): 1)  ! * Diaulad 2)  ! * Ivp Dye 3)  ! * Cats and Dogs 4)  ! * Shellfish 5)  ! 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  The patient denies anorexia, hoarseness, chest pain, syncope, abdominal pain, and severe indigestion/heartburn.     Physical Exam  General:  alert, well-developed, well-nourished, and cooperative to examination.   mildly ill with continous dry coughing Eyes:  vision grossly intact; pupils equal, round and reactive to light.  conjunctiva and lids normal.    Ears:  normal pinnae bilaterally, without erythema, swelling, or tenderness to palpation. TMs clear, without effusion, or cerumen impaction. Hearing grossly normal bilaterally  Mouth:  teeth and gums in good repair; mucous membranes moist, without lesions or ulcers. oropharynx clear without exudate, mod erythema.  Neck:  supple, full ROM, no masses, no thyromegaly; no thyroid nodules or tenderness. no JVD or carotid bruits.   Lungs:  normal respiratory effort, no intercostal retractions or use of accessory muscles; good breath sounds bilaterally - no crackles and occ exp wheezes L upper lung.    Heart:  normal rate, regular rhythm, no murmur, and no rub. BLE without edema.  Neurologic:  alert & oriented X3 and cranial nerves II-XII symetrically intact.  strength normal in all extremities, sensation intact to light touch, and gait normal. speech fluent without dysarthria or aphasia; follows commands with good comprehension.    Impression & Recommendations:  Problem # 1:  PNEUMONIA, ATYPICAL (ICD-486)  may have begun as flu but with persisitng fever > 4 days, feel symptoms warrant abx therapy - old tamiflu as outside of 1st 72 h symptoms onset - Levaquin and pred pack for infx and wheeze - cont cough suppression with codiene syrup and add tessalon - hold CXR as results would not change management out of work 48h more - note provided His updated medication list for this problem includes:    Levaquin 750 Mg Tabs (Levofloxacin) .Marland Kitchen... 1 by mouth once daily x 7 days  Orders: Prescription Created Electronically [phone])  Complete Medication List: 1)  Triamterene-hctz 37.5-25 Mg Caps (Triamterene-hctz) .... One tab by mouth once daily 2)  Nexium 40 Mg  Cpdr (Esomeprazole magnesium) .... One by mouth once daily 30 mins before meals 3)  Multivitamins Tabs (Multiple vitamin) .... Take 1 tablet by mouth once a day 4)  Cheratussin Ac 100-10 Mg/38ml Syrp (Guaifenesin-codeine) .Marland Kitchen.. 1-2 teaspoon q 6 hours as needed 5)  Levaquin 750 Mg Tabs (Levofloxacin) .Marland Kitchen.. 1 by mouth once daily x 7 days 6)  Benzonatate 200 Mg Caps (Benzonatate) .Marland Kitchen.. 1 by mouth three times a day x 7days, then as needed for cough 7)  Prednisone (pak) 10 Mg Tabs (Prednisone) .... As directed x 6days  Patient Instructions: 1)  it was good to meet you today.  2)  treat your cough  and early pneumonia symptoms as discussed - Levaquin antibiotics, pred pak, and tessalon perles - your prescriptions have been electronically submitted to your pharmacy. Please take as directed. Contact our office if you believe you're having problems with the medication(s).  3)  Get plenty of rest, drink lots of clear liquids, and use Tylenol or Ibuprofen for fever and comfort. Return in 7-10 days if you're not better:sooner if you're feeling worse. 4)  Recommended remaining out of work for another 48h, may return Sat - work note provided 5)  Please keep follow-up appointment as scheduled, sooner if problems.  Prescriptions: PREDNISONE (PAK) 10 MG TABS (PREDNISONE) as directed x 6days  #1 x 0   Entered and Authorized by:   Newt Lukes MD   Signed by:   Newt Lukes MD on 08/07/2009   Method used:   Electronically to        [facility]* (retail)       [address]       [address] Shipping/mailing       [place], [place]  [address]       Ph: [phone]       Fax: [phone]   RxID:   [id number] BENZONATATE 200 MG CAPS (BENZONATATE) 1 by mouth three times a day x 7days, then as needed for cough  #30 x 1   Entered and Authorized by:   Newt Lukes MD   Signed by:   Newt Lukes MD on 08/07/2009   Method used:   Electronically to        [facility]* (retail)       [address]       [address] Shipping/mailing       [place], [place]  [address]       Ph: [phone]       Fax: [phone]   RxID:   [id number] LEVAQUIN 750 MG TABS (LEVOFLOXACIN) 1 by mouth once daily x 7 days  #7 x 0   Entered and Authorized by:   Newt Lukes MD   Signed by:   Newt Lukes MD on 08/07/2009   Method used:   Electronically to        [facility]* (retail)       [address]       [address] Shipping/mailing       [place], [place]  [address]       Ph: [phone]       Fax: [phone]   RxID:   [id number]

## 2010-07-01 NOTE — Progress Notes (Signed)
Summary: Rx request  Phone Note Call from Patient Call back at Home Phone [phone]   Caller: Patient Summary of Call: pt called requesting an Rx to treat fever blisters. please advise Initial call taken by: Margaret Pyle, CMA,  Oct 23, 2009 12:53 PM  Follow-up for Phone Call        erx done - valtrex to [facility] pharm Follow-up by: Newt Lukes MD,  Oct 23, 2009 1:10 PM  Additional Follow-up for Phone Call Additional follow up Details #1::        pt informed Additional Follow-up by: Margaret Pyle, CMA,  Oct 23, 2009 1:22 PM    New/Updated Medications: VALACYCLOVIR HCL 1 GM TABS (VALACYCLOVIR HCL) 1 by mouth x 1 as needed for cold sores - repeat daily if needed Prescriptions: VALACYCLOVIR HCL 1 GM TABS (VALACYCLOVIR HCL) 1 by mouth x 1 as needed for cold sores - repeat daily if needed  #3 x 1   Entered and Authorized by:   Newt Lukes MD   Signed by:   Newt Lukes MD on 10/23/2009   Method used:   Electronically to        [facility]* (retail)       [address]       [address] Shipping/mailing       [place], [place]  [address]       Ph: [phone]       Fax: [phone]   RxID:   [id number]

## 2010-07-01 NOTE — Assessment & Plan Note (Signed)
Summary: NEW PT CPX/ TRANSFER FROM DR YOO/ [facility]   Vital Signs:  Patient profile:   40 year old male Height:      66 inches (167.64 cm) Weight:      261.8 pounds (119 kg) O2 Sat:      97 % on Room air Temp:     97.7 degrees F (36.50 degrees C) oral Pulse rate:   73 / minute BP sitting:   146 / 92  (left arm) Cuff size:   large  Vitals Entered By: Orlan Leavens (August 19, 2009 8:16 AM)  O2 Flow:  Room air CC: CPX/ Need refill on nexium Is Patient Diabetic? No Pain Assessment Patient in pain? no        Primary Care Provider:  Newt Lukes MD  CC:  CPX/ Need refill on nexium.  History of Present Illness: patient is here today for annual physical. Patient feels well and has no complaints.   Preventive Screening-Counseling & Management  Alcohol-Tobacco     Alcohol drinks/day: <1     Alcohol type: mixed drink, wine     Smoking Status: never     Passive Smoke Exposure: no  Caffeine-Diet-Exercise     Caffeine use/day: 2     Does Patient Exercise: no  Hep-HIV-STD-Contraception     HIV Risk: no     Sun Exposure-Excessive: occasionally  Safety-Violence-Falls     Seat Belt Use: yes     Helmet Use: n/a     Firearms in the Home: no firearms in the home     Violence in the Home: no risk noted  Clinical Review Panels:  Prevention   Last PSA:  0.54 (08/14/2009)  Immunizations   Last Tetanus Booster:  Historical (01/29/2006)   Last Flu Vaccine:  Historical (03/04/2009)  Lipid Management   Cholesterol:  152 (08/14/2009)   LDL (bad choesterol):  83 (08/14/2009)   HDL (good cholesterol):  37.90 (08/14/2009)  Diabetes Management   HgBA1C:  5.1 (12/28/2008)   Creatinine:  1.0 (08/14/2009)   Last Flu Vaccine:  Historical (03/04/2009)  CBC   WBC:  6.8 (08/14/2009)   RBC:  5.11 (08/14/2009)   Hgb:  14.9 (08/14/2009)   Hct:  44.0 (08/14/2009)   Platelets:  275.0 (08/14/2009)   MCV  86.2 (08/14/2009)   MCHC  33.9 (08/14/2009)   RDW  13.8 (08/14/2009)  PMN:  44.1 (08/14/2009)   Lymphs:  45.1 (08/14/2009)   Monos:  9.0 (08/14/2009)   Eosinophils:  0.7 (08/14/2009)   Basophil:  1.1 (08/14/2009)  Complete Metabolic Panel   Glucose:  91 (08/14/2009)   Sodium:  139 (08/14/2009)   Potassium:  3.3 (08/14/2009)   Chloride:  102 (08/14/2009)   CO2:  33 (08/14/2009)   BUN:  13 (08/14/2009)   Creatinine:  1.0 (08/14/2009)   Albumin:  3.6 (08/14/2009)   Total Protein:  6.8 (08/14/2009)   Calcium:  8.6 (08/14/2009)   Total Bili:  0.9 (08/14/2009)   Alk Phos:  57 (08/14/2009)   SGPT (ALT):  58 (08/14/2009)   SGOT (AST):  28 (08/14/2009)   Current Medications (verified): 1)  Triamterene-Hctz 37.5-25 Mg Caps (Triamterene-Hctz) .... One Tab By Mouth Once Daily 2)  Nexium 40 Mg Cpdr (Esomeprazole Magnesium) .... One By Mouth Once Daily 30 Mins Before Meals 3)  Multivitamins  Tabs (Multiple Vitamin) .... Take 1 Tablet By Mouth Once A Day 4)  Fluticasone Propionate 50 Mcg/act Susp (Fluticasone Propionate) .Marland Kitchen.. 1 Spray Each Nostril  Every Morning  Allergies (  verified): 1)  ! * Diaulad 2)  ! * Ivp Dye 3)  ! * Cats and Dogs 4)  ! * Shellfish 5)  ! * Peanuts  Past History:  Past medical, surgical, family and social histories (including risk factors) reviewed, and no changes noted (except as noted below).  Past Medical History: Reviewed history from 08/07/2009 and no changes required. HYPERTENSION (ICD-401.9) MORBID OBESITY (ICD-278.01) GERD (ICD-530.81) ANAL FISSURE (ICD-565.0) GLUCOSE INTOLERANCE (ICD-271.3)  TINEA CRURIS (ICD-110.3) NEPHROLITHIASIS (ICD-592.0)  Past Surgical History: Reviewed history from 05/09/2009 and no changes required. Tonsillectomy 1991 closed wrist reduction age 72       Family History: Reviewed history from 05/09/2009 and no changes required. Family History of CAD Male 1st degree relative <50 Family History Hypertension Family History of Prostate CA 1st degree relative <50 Family History of Stroke M  1st degree relative <50        Social History: Reviewed history from 08/07/2009 and no changes required. 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 I have reviewed all other systems and they were negative.   Physical Exam  General:  alert, well-developed, well-nourished, and cooperative to examination.   overweight-appearing.   Eyes:  vision grossly intact; pupils equal, round and reactive to light.  conjunctiva and lids normal.    Ears:  normal pinnae bilaterally, without erythema, swelling, or tenderness to palpation. TMs clear, without effusion, or cerumen impaction. Hearing grossly normal bilaterally  Mouth:  teeth and gums in good repair; mucous membranes moist, without lesions or ulcers. oropharynx clear without exudate, no erythema.  Neck:  thick, supple, full ROM, no masses, no thyromegaly; no thyroid nodules or tenderness. no JVD or carotid bruits.   Lungs:  normal respiratory effort, no intercostal retractions or use of accessory muscles; normal breath sounds bilaterally - no crackles and no wheezes.    Heart:  normal rate, regular rhythm, no murmur, and no rub. BLE without edema. normal DP pulses and normal cap refill in all 4 extremities    Abdomen:  soft, non-tender, normal bowel sounds, no distention; no masses and no appreciable hepatomegaly or splenomegaly.   Rectal:  defer Prostate:  defer with normal PSA Msk:  No deformity or scoliosis noted of thoracic or lumbar spine.   Neurologic:  alert & oriented X3 and cranial nerves II-XII symetrically intact.  strength normal in all extremities, sensation intact to light touch, and gait normal. speech fluent without dysarthria or aphasia; follows commands with good comprehension.  Skin:  no rashes, vesicles, ulcers, or erythema. No nodules or irregularity to palpation.  Psych:  Oriented  X3, memory intact for recent and remote, normally interactive, good eye contact, not anxious appearing, not depressed appearing, and not agitated.      Impression & Recommendations:  Problem # 1:  WELL ADULT (ICD-V70.0)  Patient has been counseled on age-appropriate routine health concerns for screening and prevention. These are reviewed and up-to-date. Immunizations are up-to-date or declined. Labs and ECG reviewed.   Orders: EKG w/ Interpretation (93000)  Complete Medication List: 1)  Triamterene-hctz 37.5-25 Mg Caps (Triamterene-hctz) .... One tab by mouth once daily 2)  Nexium 40 Mg Cpdr (Esomeprazole magnesium) .... One by mouth once daily 30 mins before meals 3)  Multivitamins Tabs (Multiple vitamin) .... Take 1 tablet by mouth once a  day 4)  Fluticasone Propionate 50 Mcg/act Susp (Fluticasone propionate) .Marland Kitchen.. 1 spray each nostril  every morning  Patient Instructions: 1)  it was good to see you today. 2)  EKG, labs and exam look good 3)  it is important that you work on losing weight and getting active - monitor your diet and consume fewer calories such as less carbohydrates (sugar) and less fat. 4)  Please schedule a follow-up appointment in 3 months to recheck blood pressure, sooner if problems.  Prescriptions: NEXIUM 40 MG CPDR (ESOMEPRAZOLE MAGNESIUM) one by mouth once daily 30 mins before meals  #90 x 3   Entered by:   Orlan Leavens   Authorized by:   Newt Lukes MD   Signed by:   Orlan Leavens on 08/19/2009   Method used:   Electronically to        [facility]* (retail)       [address]       [address] Shipping/mailing       [place], [place]  [address]       Ph: [phone]       Fax: [phone]   RxID:   [id number]    Orders Added: 1)  EKG w/ Interpretation [93000] 2)  Est. Patient 40-64 years [99396] 3)  EKG w/ Interpretation [93000]

## 2010-07-01 NOTE — Assessment & Plan Note (Signed)
Summary: SORE THROAT-FEVER--STC   Vital Signs:  Patient profile:   40 year old male Height:      64 inches (162.56 cm) Weight:      267.4 pounds (121.55 kg) O2 Sat:      97 % on Room air Temp:     99.7 degrees F (37.61 degrees C) oral Pulse rate:   105 / minute BP sitting:   128 / 90  (left arm) Cuff size:   large  Vitals Entered By: Orlan Leavens (November 18, 2009 10:40 AM)  O2 Flow:  Room air CC: cough & sore throat, URI symptoms Is Patient Diabetic? No   Primary Care Provider:  Newt Lukes MD  CC:  cough & sore throat and URI symptoms.  History of Present Illness:  URI Symptoms      This is a 40 year old man who presents with URI symptoms.  The symptoms began 2 days ago.  The severity is described as moderate.  The patient reports sore throat, earache, and sick contacts, but denies nasal congestion, dry cough, and productive cough.  Associated symptoms include low grade fever.  The patient denies rash, vomiting, and diarrhea.  The patient also reports headache and muscle aches.  The patient denies seasonal symptoms and severe fatigue.  The patient denies the following risk factors for Strep sinusitis: Strep exposure and tender   also f/u obesity -  undergoing eval for possible lap band has been to meeting and begun process to approve for surg - EKG, barium swallow, nutritionist and sleep study all this week --  Clinical Review Panels:  Lipid Management   Cholesterol:  152 (08/14/2009)   LDL (bad choesterol):  83 (08/14/2009)   HDL (good cholesterol):  37.90 (08/14/2009)  Diabetes Management   HgBA1C:  5.1 (12/28/2008)   Creatinine:  1.0 (08/14/2009)   Last Flu Vaccine:  Historical (03/04/2009)  CBC   WBC:  6.8 (08/14/2009)   RBC:  5.11 (08/14/2009)   Hgb:  14.9 (08/14/2009)   Hct:  44.0 (08/14/2009)   Platelets:  275.0 (08/14/2009)   MCV  86.2 (08/14/2009)   MCHC  33.9 (08/14/2009)   RDW  13.8 (08/14/2009)   PMN:  44.1 (08/14/2009)   Lymphs:  45.1  (08/14/2009)   Monos:  9.0 (08/14/2009)   Eosinophils:  0.7 (08/14/2009)   Basophil:  1.1 (08/14/2009)  Complete Metabolic Panel   Glucose:  91 (08/14/2009)   Sodium:  139 (08/14/2009)   Potassium:  3.3 (08/14/2009)   Chloride:  102 (08/14/2009)   CO2:  33 (08/14/2009)   BUN:  13 (08/14/2009)   Creatinine:  1.0 (08/14/2009)   Albumin:  3.6 (08/14/2009)   Total Protein:  6.8 (08/14/2009)   Calcium:  8.6 (08/14/2009)   Total Bili:  0.9 (08/14/2009)   Alk Phos:  57 (08/14/2009)   SGPT (ALT):  58 (08/14/2009)   SGOT (AST):  28 (08/14/2009)   Current Medications (verified): 1)  Triamterene-Hctz 37.5-25 Mg Caps (Triamterene-Hctz) .... One Tab By Mouth Once Daily 2)  Nexium 40 Mg Cpdr (Esomeprazole Magnesium) .... One By Mouth Once Daily 30 Mins Before Meals 3)  Multivitamins  Tabs (Multiple Vitamin) .... Take 1 Tablet By Mouth Once A Day 4)  Fluticasone Propionate 50 Mcg/act Susp (Fluticasone Propionate) .Marland Kitchen.. 1 Spray Each Nostril  Every Morning 5)  Valacyclovir Hcl 1 Gm Tabs (Valacyclovir Hcl) .Marland Kitchen.. 1 By Mouth X 1 As Needed For Cold Sores - Repeat Daily If Needed  Allergies (verified): 1)  ! *  Diaulad 2)  ! * Ivp Dye 3)  ! * Cats and Dogs 4)  ! * Shellfish 5)  ! * Peanuts  Past History:  Past Medical History: HYPERTENSION MORBID OBESITY GERD ANAL FISSURE  GLUCOSE INTOLERANCE, hx of NEPHROLITHIASIS, hx of  MD roster: pulm - Sood  Review of Systems  The patient denies hoarseness, syncope, peripheral edema, and abdominal pain.    Physical Exam  General:  alert, well-developed, well-nourished, and cooperative to examination. nontoxic, overweight-appearing.   Eyes:  vision grossly intact; pupils equal, round and reactive to light.  conjunctiva and lids normal.    Ears:  hazy L TM Mouth:  teeth and gums in good repair; mucous membranes moist, without lesions or ulcers. oropharynx clear without exudate, mild erythema. +PND  Lungs:  normal respiratory effort, no  intercostal retractions or use of accessory muscles; normal breath sounds bilaterally - no crackles and no wheezes.    Heart:  normal rate, regular rhythm, no murmur, and no rub. BLE without edema.   Impression & Recommendations:  Problem # 1:  UNSPECIFIED SINUSITIS (ICD-473.9)  His updated medication list for this problem includes:    Fluticasone Propionate 50 Mcg/act Susp (Fluticasone propionate) .Marland Kitchen... 1 spray each nostril  every morning    Levaquin 500 Mg Tabs (Levofloxacin) .Marland Kitchen... 1 by mouth once daily x 7 days  Take antibiotics for full duration. Discussed treatment options including OTC meds to include antihistamine once daily   Orders: Prescription Created Electronically [phone])  Problem # 2:  MORBID OBESITY (ICD-278.01)  process towards lap band reviewed - f/u as planned inlcuding sleep study, nutrition counseling, etc -- encouraged to continue exercise efforts (walking) as tolerated and diet   Ht: 64 (11/18/2009)   Wt: 267.4 (11/18/2009)   BMI: 43.41 (10/14/2009)  Complete Medication List: 1)  Triamterene-hctz 37.5-25 Mg Caps (Triamterene-hctz) .... One tab by mouth once daily 2)  Nexium 40 Mg Cpdr (Esomeprazole magnesium) .... One by mouth once daily 30 mins before meals 3)  Multivitamins Tabs (Multiple vitamin) .... Take 1 tablet by mouth once a day 4)  Fluticasone Propionate 50 Mcg/act Susp (Fluticasone propionate) .Marland Kitchen.. 1 spray each nostril  every morning 5)  Valacyclovir Hcl 1 Gm Tabs (Valacyclovir hcl) .Marland Kitchen.. 1 by mouth x 1 as needed for cold sores - repeat daily if needed 6)  Levaquin 500 Mg Tabs (Levofloxacin) .Marland Kitchen.. 1 by mouth once daily x 7 days 7)  Loratadine 10 Mg Tabs (Loratadine) .Marland Kitchen.. 1 by mouth once daily  Patient Instructions: 1)  it was good to see you today.  2)  Levaquin and generic claritin for your head symptoms - your prescriptions have been electronically submitted to your pharmacy. Please take as directed. Contact our office if you believe you're having  problems with the medication(s).  3)  Please schedule follow-up appointment in 3-6 months to review weight issues, call sooner if other problems. (ok to cancel thurs visit as discussed) Prescriptions: LORATADINE 10 MG TABS (LORATADINE) 1 by mouth once daily  #30 x 3   Entered and Authorized by:   Newt Lukes MD   Signed by:   Newt Lukes MD on 11/18/2009   Method used:   Electronically to        [facility]* (retail)       [address]       [address] Shipping/mailing       [place], [place]  [address]       Ph:  [phone]       Fax: [phone]   RxID:   [id number] LEVAQUIN 500 MG TABS (LEVOFLOXACIN) 1 by mouth once daily x 7 days  #7 x 0   Entered and Authorized by:   Newt Lukes MD   Signed by:   Newt Lukes MD on 11/18/2009   Method used:   Electronically to        [facility]* (retail)       [address]       [address] Shipping/mailing       [place], [place]  [address]       Ph: [phone]       Fax: [phone]   RxID:   [id number]

## 2010-07-01 NOTE — Progress Notes (Signed)
Summary: referral  Phone Note Call from Patient Call back at Home Phone [phone]   Caller: Patient Summary of Call: pt called to ask MD to make referral to have sleep study done as discussed at OV Initial call taken by: Margaret Pyle, CMA,  Sep 30, 2009 10:31 AM  Follow-up for Phone Call        will do - order requested for pulm eval to look for ?OSA Follow-up by: Newt Lukes MD,  Sep 30, 2009 10:43 AM  Additional Follow-up for Phone Call Additional follow up Details #1::        pt informed Additional Follow-up by: Margaret Pyle, CMA,  Sep 30, 2009 10:51 AM  New Problems: HYPERSOMNIA UNSPECIFIED (ICD-780.54)   New Problems: HYPERSOMNIA UNSPECIFIED (ICD-780.54)

## 2010-07-01 NOTE — Progress Notes (Signed)
Summary: needs OV w/ VS for CPAP  ---- Converted from flag ---- ---- 03/24/2010 2:16 PM, Michel Bickers CMA wrote: Needs 2 mo f/u with VS in Dec for cpap. ------------------------------  Phone Note Outgoing Call   Call placed by: Michel Bickers CMA,  April 01, 2010 3:45 PM Call placed to: Patient Summary of Call: [place]. Pt needs ROV with VS to see how he is doing on CPAP. Initial call taken by: Michel Bickers CMA,  April 01, 2010 3:46 PM  Follow-up for Phone Call        Spoke with patient and he will callback on 11/3 to sch follow-up after he looks at his work schedule.Michel Bickers CMA  April 02, 2010 5:27 PM  [facility].Michel Bickers CMA  April 04, 2010 1:48 PM\[facility].Michel Bickers [facility]  April 07, 2010 4:40 PM   Additional Follow-up for Phone Call Additional follow up Details #1::        Patient has not returned call to sch appt. I will send a letter to the pt's home. Additional Follow-up by: Michel Bickers CMA,  April 09, 2010 12:40 PM

## 2010-07-01 NOTE — Miscellaneous (Signed)
Summary: Overnight polysomnogram  Clinical Lists Changes AHI 11.4, RDI 50, oxygen nadir 84%.  REM AHI 67.5, Supine AHI 12.1.  PLMI 0.  Will have my nurse call to schedule next available rov to discuss results.  Appended Document: Overnight polysomnogram LMOMTCB.  Appended Document: Overnight polysomnogram Pt is sch for 01/01/2010 to discuss sleep results.

## 2010-07-01 NOTE — Letter (Signed)
Summary: [facility]   Imported By: Sherian Rein 11/19/2009 10:36:29  _____________________________________________________________________  External Attachment:    Type:   Image     Comment:   External Document

## 2010-07-01 NOTE — Letter (Signed)
Summary: [facility]   Imported By: Lennie Odor 02/21/2010 16:13:52  _____________________________________________________________________  External Attachment:    Type:   Image     Comment:   External Document

## 2010-07-01 NOTE — Progress Notes (Signed)
Summary: nos appt  Phone Note Call from Patient   Caller: [email or web address]  Call For: sood Summary of Call: Rsc nos from 8/3 to 8/22 @ 4p. Initial call taken by: Darletta Moll,  January 02, 2010 10:03 AM

## 2010-07-01 NOTE — Assessment & Plan Note (Signed)
Summary: ? sleep/apc   Visit Type:  Initial Consult Primary Provider/Referring Provider:  Newt Lukes MD  CC:  Sleep consult.  Epworth score is 11.Marland Kitchen  History of Present Illness: 40 yo male for sleep evaluation.  He has been noticing trouble with his sleep for years.  His partner has told him he stops breathing while asleep, and snores very loud.  He goes to bed at 1030pm, but will fall asleep before watching TV.  He wakes up several times during the nigth.  He gets out of bed at 6 am on workdays, and 8 am on his days off.  He feels tired in the morning, and gets headaches in the mornings at times.  He does not use anything to help him sleep or stay awake.  He denies sleep talking, bruxism, or nightmares.  He will occasional talk in his sleep.  He gets frequent leg symptoms.  He denies sleep hallucinations, sleep paralysis, or cataplexy.  His weight has been steady, and he is being evaluated for lap band surgery.  Preventive Screening-Counseling & Management  Alcohol-Tobacco     Alcohol drinks/day: <1     Smoking Status: never  Current Medications (verified): 1)  Triamterene-Hctz 37.5-25 Mg Caps (Triamterene-Hctz) .... One Tab By Mouth Once Daily 2)  Nexium 40 Mg Cpdr (Esomeprazole Magnesium) .... One By Mouth Once Daily 30 Mins Before Meals 3)  Multivitamins  Tabs (Multiple Vitamin) .... Take 1 Tablet By Mouth Once A Day 4)  Fluticasone Propionate 50 Mcg/act Susp (Fluticasone Propionate) .Marland Kitchen.. 1 Spray Each Nostril  Every Morning  Allergies (verified): 1)  ! * Diaulad 2)  ! * Ivp Dye 3)  ! * Cats and Dogs 4)  ! * Shellfish 5)  ! * Peanuts  Past History:  Past Medical History: Reviewed history from 09/27/2009 and no changes required. HYPERTENSION (ICD-401.9) MORBID OBESITY (ICD-278.01) GERD (ICD-530.81) ANAL FISSURE (ICD-565.0) GLUCOSE INTOLERANCE (ICD-271.3)  TINEA CRURIS (ICD-110.3) NEPHROLITHIASIS (ICD-592.0)  Past Surgical History: Reviewed history from  05/09/2009 and no changes required. Tonsillectomy 1991 closed wrist reduction age 78       Family History: Reviewed history from 09/27/2009 and no changes required. Family History of CAD Male 1st degree relative <50 Family History Hypertension Family History of Prostate CA 1st degree relative <50 Family History of Stroke M 1st degree relative <50    mom expired age 55y - CAD/MI dad expired age 83y - prostate ca, CVA, HTN, A fib, MI x 1 oldest bro expired age 85s - cardiac complications CABG and valve 2nd bro - age 30s s/p CABG age 58s sister - HTN        Social History: Reviewed history from 08/07/2009 and no changes required. 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  The patient denies shortness of breath at rest, productive cough, non-productive cough, coughing up blood, chest pain, irregular heartbeats, acid heartburn, indigestion, loss of appetite, weight change, abdominal pain, difficulty swallowing, sore throat, tooth/dental problems, headaches, sneezing, itching, ear ache, anxiety, depression, hand/feet swelling, rash, change in color of mucus, and fever.    Vital Signs:  Patient profile:   40 year old male Height:      64 inches (162.56 cm) Weight:  252 pounds (114.55 kg) BMI:     43.41 O2 Sat:      94 % on Room air Temp:     98.0 degrees F (36.67 degrees C) oral Pulse rate:   91 / minute BP sitting:   120 / 76  (left arm) Cuff size:   large  Vitals Entered By: Michel Bickers CMA (Oct 14, 2009 11:39 AM)  O2 Sat at Rest %:  94 O2 Flow:  Room air CC: Sleep consult.  Epworth score is 11. Is Patient Diabetic? No   Physical Exam  General:  normal appearance, healthy appearing, and obese.   Eyes:  PERRLA and EOMI.    Nose:  no deformity, discharge, inflammation, or lesions Mouth:  MP 4, enlarged tongue, scalloped tongue borders Neck:  no JVD.   Chest Wall:  no deformities noted Lungs:  clear bilaterally to auscultation and percussion Heart:  regular rate and rhythm, S1, S2 without murmurs, rubs, gallops, or clicks Abdomen:  bowel sounds positive; abdomen soft and non-tender without masses, or organomegaly Msk:  no deformity or scoliosis noted with normal posture Pulses:  pulses normal Extremities:  no clubbing, cyanosis, edema, or deformity noted Neurologic:  CN II-XII grossly intact with normal reflexes, coordination, muscle strength and tone Cervical Nodes:  no significant adenopathy Psych:  alert and cooperative; normal mood and affect; normal attention span and concentration   Impression & Recommendations:  Problem # 1:  HYPERSOMNIA UNSPECIFIED (ICD-780.54)  He has sleep disruption and history of hypertension.  I am concerned that he may have sleep apnea.  To further evaluate this will schedule sleep test.  I have explained the diagnosis of sleep apnea.  Driving precautions were discussed.  Explained how his weight is affecting his sleep.  He is being evaluated for lap band surgery.  Problem # 2:  RESTLESS LEG SYNDROME (ICD-333.94) He has symptoms suggestive of restless leg syndrome.  Will assess this further after review of his sleep study.  Complete Medication List: 1)  Triamterene-hctz 37.5-25 Mg Caps (Triamterene-hctz) .... One tab by mouth once daily 2)  Nexium 40 Mg Cpdr (Esomeprazole magnesium) .... One by mouth once daily 30 mins before meals 3)  Multivitamins Tabs (Multiple vitamin) .... Take 1 tablet by mouth once a day 4)  Fluticasone Propionate 50 Mcg/act Susp (Fluticasone propionate) .Marland Kitchen.. 1 spray each nostril  every morning  Other Orders: Consultation Level IV ([address]) Sleep Disorder Referral (Sleep Disorder)  Patient Instructions: 1)  Will schedule sleep test 2)  Will  call to schedule follow up after sleep test is reviewed

## 2010-07-01 NOTE — Letter (Signed)
Summary: [facility]  [facility]   Imported By: Sherian Rein 11/27/2009 14:51:08  _____________________________________________________________________  External Attachment:    Type:   Image     Comment:   External Document

## 2010-07-01 NOTE — Assessment & Plan Note (Signed)
Summary: sore throat/sinus/cd   Vital Signs:  Patient profile:   40 year old male Height:      64 inches (162.56 cm) Weight:      265.8 pounds (120.82 kg) O2 Sat:      96 % on Room air Temp:     97.2 degrees F (36.22 degrees C) oral Pulse rate:   105 / minute BP sitting:   132 / 92  (left arm) Cuff size:   large  Vitals Entered By: Orlan Leavens RMA (March 03, 2010 1:00 PM)  O2 Flow:  Room air CC: Sinus & sore throat, URI symptoms Is Patient Diabetic? No Pain Assessment Patient in pain? no        Primary Care Provider:  Newt Lukes MD  CC:  Sinus & sore throat and URI symptoms.  History of Present Illness:  URI Symptoms      This is a 40 year old man who presents with URI symptoms.  The symptoms began 4 days ago.  The severity is described as moderate.  c/o pain and fullness of right ear. Using usual allergy meds (flonase and antihistamine) w/o improved symptoms.  The patient reports nasal congestion, purulent nasal discharge, sore throat, and earache, but denies dry cough and productive cough.  Associated symptoms include low-grade fever (<100.5 degrees) and use of an antipyretic.  The patient denies stiff neck, dyspnea, wheezing, rash, vomiting, and diarrhea.  The patient denies itchy throat, sneezing, seasonal symptoms, response to antihistamine, headache, muscle aches, and severe fatigue.  Risk factors for Strep sinusitis include unilateral facial pain, poor response to decongestant, and tender adenopathy.  The patient denies the following risk factors for Strep sinusitis: tooth pain.     right kidney stone - surg for same 01/2010 resolved hematuria and pain  obesity -  s/p eval and clearance for bariatric surg EKG, barium swallow, nutritionist, [place] and sleep study all complete -- surg sched for 03/18/10  Current Medications (verified): 1)  Triamterene-Hctz 37.5-25 Mg Caps (Triamterene-Hctz) .... One Tab By Mouth Once Daily 2)  Nexium 40 Mg Cpdr (Esomeprazole  Magnesium) .... One By Mouth Once Daily 30 Mins Before Meals 3)  Multivitamins  Tabs (Multiple Vitamin) .... Take 1 Tablet By Mouth Once A Day 4)  Fluticasone Propionate 50 Mcg/act Susp (Fluticasone Propionate) .Marland Kitchen.. 1 Spray Each Nostril  Every Morning 5)  Valacyclovir Hcl 1 Gm Tabs (Valacyclovir Hcl) .Marland Kitchen.. 1 By Mouth X 1 As Needed For Cold Sores - Repeat Daily If Needed 6)  Loratadine 10 Mg Tabs (Loratadine) .Marland Kitchen.. 1 By Mouth Once Daily  Allergies (verified): 1)  ! * Diaulad 2)  ! * Ivp Dye 3)  ! * Cats and Dogs 4)  ! * Shellfish 5)  ! * Peanuts  Past History:  Past Medical History: HYPERTENSION MORBID OBESITY GERD ANAL FISSURE  GLUCOSE INTOLERANCE, hx of NEPHROLITHIASIS, hx of  MD roster:   pulm - Sood uro -  wrenn  Review of Systems  The patient denies decreased hearing, peripheral edema, and abdominal pain.    Physical Exam  General:  alert, well-developed, well-nourished, and cooperative to examination. nontoxic, overweight-appearing.   Eyes:  vision grossly intact; pupils equal, round and reactive to light.  conjunctiva and lids normal.    Ears:  R TM hazt with effusion - L ear normal Nose:  no sinus pain to palp over maxillary or frontal sinus region Mouth:  teeth and gums in good repair; mucous membranes moist, without lesions or ulcers.  oropharynx clear without exudate, min erythema.  Neck:  thick, supple, full ROM, no masses, no thyromegaly; no thyroid nodules or tenderness. no JVD or carotid bruits.   Lungs:  normal respiratory effort, no intercostal retractions or use of accessory muscles; normal breath sounds bilaterally - no crackles and no wheezes.    Heart:  normal rate, regular rhythm, no murmur, and no rub. BLE without edema.   Impression & Recommendations:  Problem # 1:  OTITIS MEDIA, RIGHT (ICD-382.9)  abx and cont steroid nose spray +antihist/decongest  His updated medication list for this problem includes:    Augmentin 875-125 Mg Tabs  (Amoxicillin-pot clavulanate) .Marland Kitchen... 1 by mouth two times a day x 7 days  Instructed on prevention and treatment. Call if no improvement in 48-72 hours or sooner if worsening symptoms.   Orders: Prescription Created Electronically [phone])  Complete Medication List: 1)  Triamterene-hctz 37.5-25 Mg Caps (Triamterene-hctz) .... One tab by mouth once daily 2)  Nexium 40 Mg Cpdr (Esomeprazole magnesium) .... One by mouth once daily 30 mins before meals 3)  Multivitamins Tabs (Multiple vitamin) .... Take 1 tablet by mouth once a day 4)  Fluticasone Propionate 50 Mcg/act Susp (Fluticasone propionate) .Marland Kitchen.. 1 spray each nostril  every morning 5)  Valacyclovir Hcl 1 Gm Tabs (Valacyclovir hcl) .Marland Kitchen.. 1 by mouth x 1 as needed for cold sores - repeat daily if needed 6)  Loratadine 10 Mg Tabs (Loratadine) .Marland Kitchen.. 1 by mouth once daily 7)  Augmentin 875-125 Mg Tabs (Amoxicillin-pot clavulanate) .Marland Kitchen.. 1 by mouth two times a day x 7 days  Patient Instructions: 1)  it was good to see you today. 2)  augmentin for ear and sinus infection - your prescription has been electronically submitted to your pharmacy. Please take as directed. Contact our office if you believe you're having problems with the medication(s).  3)  Get plenty of rest, drink lots of clear liquids, and use Tylenol or Ibuprofen for fever and comfort. Return in 7-10 days if you're not better:sooner if you're feeling worse. Prescriptions: AUGMENTIN 875-125 MG TABS (AMOXICILLIN-POT CLAVULANATE) 1 by mouth two times a day x 7 days  #14 x 0   Entered and Authorized by:   Newt Lukes MD   Signed by:   Newt Lukes MD on 03/03/2010   Method used:   Electronically to        [facility]* (retail)       [address]       [address] Shipping/mailing       [place], [place]  [address]       Ph: [phone]       Fax: [phone]   RxID:   [id number]

## 2010-07-01 NOTE — Consult Note (Signed)
[facility]   Imported By: Sherian Rein 02/13/2010 09:17:31  _____________________________________________________________________  External Attachment:    Type:   Image     Comment:   External Document

## 2010-07-01 NOTE — Letter (Signed)
Summary: Out of Work  [facility]  [address] [place], [place] [address]   Phone: [phone]  Fax: [phone]    August 07, 2009   Employee:  Brad Barrera    To Whom It May Concern:   For Medical reasons, please excuse the above named employee from work for the following dates:  Start: 08/07/09    End: 08/09/09, May return to work on Saturday 08/10/09    If you need additional information, please feel free to contact our office.         Sincerely,    Dr. Rene Paci

## 2010-07-01 NOTE — Letter (Signed)
Summary: [facility]   Imported By: Sherian Rein 03/24/2010 13:11:01  _____________________________________________________________________  External Attachment:    Type:   Image     Comment:   External Document

## 2010-07-01 NOTE — Letter (Signed)
SummaryScience writer Pulmonary Care Appointment Letter  [facility]  [address]   [place], [place] [address]   Phone: [phone]  Fax: [phone]    04/09/2010 MRN: [id number]  Brad Barrera [address] [place], [place]  [address]  Dear Mr. Barrera,   Our office is attempting to contact you about an appointment.  Please call our office at [phone] to schedule this appointment with Dr.___Sood____.  Our registration staff is prepared to assist you with any questions you may have.    Thank you,   Nature conservation officer Pulmonary Division

## 2010-07-01 NOTE — Letter (Signed)
Summary: Generic Letter  [facility]  [address] [place], [place] [address]   Phone: [phone]  Fax: [phone]    09/30/2009  Dr. Rene Paci [address] [place] [address]  RE: Brad Barrera. [place] DOB: 1970/10/11  To Whom it may concern:  The above named patient has been seen by our office for 2 years. He suffers from the following co-morbidities Hypertension, Glucose intolerence, Possible sleep apnea, Degenerative disc disease, and Low Back Pain.  His current weight is 264 lbs, height 64 inches, and BMI 45.6.  The patient has undergone the following weight loss attempts: Weight Watchers, Slim Fast, and previously tried prescription Fen Phen, Phenteramine, and [place].  I feel this patient would benefit from weight loss surgery because he has been unsuccesfull losing weight with other diet methods, and his medical conditions will become life threating if he does not get his weight under control.  I appreciate your consideration. Please contact me for further questions.  Sincerely,    Dr. Rene Paci

## 2010-07-01 NOTE — Letter (Signed)
Summary: [facility]   Imported By: Sherian Rein 04/22/2010 08:01:55  _____________________________________________________________________  External Attachment:    Type:   Image     Comment:   External Document

## 2010-07-01 NOTE — Progress Notes (Signed)
Summary: cpap  Phone Note Call from Patient Call back at [facility] Phone [phone]   Caller: Patient Call For: SOOD Summary of Call: questions re: cpap order.  Initial call taken by: Tivis Ringer, CNA,  March 14, 2010 2:26 PM  Follow-up for Phone Call        spoke to pt he just wanted to know what the process of getting his cpap was he was given the infor and the # to hometown 02 incase he has not heard from them soon Follow-up by: Oneita Jolly,  March 14, 2010 2:44 PM

## 2010-07-03 NOTE — Progress Notes (Signed)
Summary: Labs  Phone Note Call from Patient Call back at Home Phone [phone]   Caller: Patient Summary of Call: Pt called requesting to have HIV testing added to CPX labs in 07/2010, Okay to add? Initial call taken by: Margaret Pyle, CMA,  May 12, 2010 2:20 PM  Follow-up for Phone Call        ok to add - icd9 v01.6 Follow-up by: Newt Lukes MD,  May 12, 2010 5:13 PM  Additional Follow-up for Phone Call Additional follow up Details #1::        Lab added, pt informed Additional Follow-up by: Margaret Pyle, CMA,  May 13, 2010 8:58 AM

## 2010-07-03 NOTE — Letter (Signed)
[facility]   Imported By: Sherian Rein 05/28/2010 08:14:36  _____________________________________________________________________  External Attachment:    Type:   Image     Comment:   External Document

## 2010-07-07 ENCOUNTER — Ambulatory Visit: Payer: Self-pay | Admitting: Internal Medicine

## 2010-07-14 ENCOUNTER — Ambulatory Visit: Payer: Self-pay | Admitting: Internal Medicine

## 2010-07-17 NOTE — Letter (Signed)
Summary: [facility]   Imported By: Sherian Rein 07/07/2010 08:20:32  _____________________________________________________________________  External Attachment:    Type:   Image     Comment:   External Document

## 2010-08-13 LAB — DIFFERENTIAL
Basophils Absolute: 0 10*3/uL (ref 0.0–0.1)
Basophils Relative: 1 % (ref 0–1)
Eosinophils Absolute: 0 10*3/uL (ref 0.0–0.7)
Eosinophils Relative: 1 % (ref 0–5)
Lymphocytes Relative: 35 % (ref 12–46)
Lymphs Abs: 2.1 10*3/uL (ref 0.7–4.0)
Monocytes Absolute: 0.5 10*3/uL (ref 0.1–1.0)
Monocytes Relative: 8 % (ref 3–12)
Neutro Abs: 3.4 10*3/uL (ref 1.7–7.7)
Neutrophils Relative %: 56 % (ref 43–77)

## 2010-08-13 LAB — CBC
HCT: 38.2 % — ABNORMAL LOW (ref 39.0–52.0)
Hemoglobin: 13 g/dL (ref 13.0–17.0)
MCH: 28.9 pg (ref 26.0–34.0)
MCHC: 34.1 g/dL (ref 30.0–36.0)
MCV: 84.9 fL (ref 78.0–100.0)
Platelets: 210 10*3/uL (ref 150–400)
RBC: 4.5 MIL/uL (ref 4.22–5.81)
RDW: 14.6 % (ref 11.5–15.5)
WBC: 6.1 10*3/uL (ref 4.0–10.5)

## 2010-08-14 ENCOUNTER — Other Ambulatory Visit: Payer: Commercial Managed Care - PPO

## 2010-08-14 ENCOUNTER — Telehealth: Payer: Self-pay | Admitting: Internal Medicine

## 2010-08-14 ENCOUNTER — Encounter: Payer: Self-pay | Admitting: Internal Medicine

## 2010-08-14 ENCOUNTER — Other Ambulatory Visit: Payer: Self-pay | Admitting: Internal Medicine

## 2010-08-14 ENCOUNTER — Encounter (INDEPENDENT_AMBULATORY_CARE_PROVIDER_SITE_OTHER): Payer: Self-pay | Admitting: *Deleted

## 2010-08-14 DIAGNOSIS — Z0389 Encounter for observation for other suspected diseases and conditions ruled out: Secondary | ICD-10-CM

## 2010-08-14 DIAGNOSIS — Z0000 Encounter for general adult medical examination without abnormal findings: Secondary | ICD-10-CM

## 2010-08-14 LAB — DIFFERENTIAL
Basophils Absolute: 0 10*3/uL (ref 0.0–0.1)
Basophils Relative: 1 % (ref 0–1)
Eosinophils Absolute: 0 10*3/uL (ref 0.0–0.7)
Eosinophils Relative: 0 % (ref 0–5)
Eosinophils Relative: 1 % (ref 0–5)
Lymphocytes Relative: 33 % (ref 12–46)
Lymphocytes Relative: 41 % (ref 12–46)
Lymphs Abs: 2.1 10*3/uL (ref 0.7–4.0)
Lymphs Abs: 3.7 10*3/uL (ref 0.7–4.0)
Monocytes Absolute: 0.3 10*3/uL (ref 0.1–1.0)
Monocytes Absolute: 0.6 10*3/uL (ref 0.1–1.0)
Monocytes Relative: 5 % (ref 3–12)
Monocytes Relative: 7 % (ref 3–12)
Neutro Abs: 3.9 10*3/uL (ref 1.7–7.7)
Neutrophils Relative %: 61 % (ref 43–77)

## 2010-08-14 LAB — COMPREHENSIVE METABOLIC PANEL
ALT: 55 U/L — ABNORMAL HIGH (ref 0–53)
AST: 39 U/L — ABNORMAL HIGH (ref 0–37)
Albumin: 4.2 g/dL (ref 3.5–5.2)
Alkaline Phosphatase: 63 U/L (ref 39–117)
BUN: 14 mg/dL (ref 6–23)
CO2: 27 mEq/L (ref 19–32)
Calcium: 9.7 mg/dL (ref 8.4–10.5)
Chloride: 101 mEq/L (ref 96–112)
Creatinine, Ser: 0.99 mg/dL (ref 0.4–1.5)
GFR calc Af Amer: >60 mL/min (ref >60)
GFR calc non Af Amer: >60 mL/min (ref >60)
Glucose, Bld: 99 mg/dL (ref 70–99)
Potassium: 3.3 mEq/L — ABNORMAL LOW (ref 3.5–5.1)
Sodium: 136 mEq/L (ref 135–145)
Total Bilirubin: 0.7 mg/dL (ref 0.3–1.2)
Total Protein: 8 g/dL (ref 6.0–8.3)

## 2010-08-14 LAB — BASIC METABOLIC PANEL
BUN: 23 mg/dL (ref 6–23)
BUN: 17 mg/dL (ref 6–23)
Chloride: 105 mEq/L (ref 96–112)
Creatinine, Ser: 0.8 mg/dL (ref 0.4–1.5)
GFR: 121.43 mL/min (ref >60.00)
Glucose, Bld: 79 mg/dL (ref 70–99)
Potassium: 4.4 mEq/L (ref 3.5–5.1)
Potassium: 4.1 mEq/L (ref 3.5–5.1)
Sodium: 143 mEq/L (ref 135–145)

## 2010-08-14 LAB — CBC
HCT: 45.9 % (ref 39.0–52.0)
HCT: 44.5 % (ref 39.0–52.0)
Hemoglobin: 15.9 g/dL (ref 13.0–17.0)
Hemoglobin: 15.3 g/dL (ref 13.0–17.0)
MCH: 29 pg (ref 26.0–34.0)
MCHC: 34.6 g/dL (ref 30.0–36.0)
MCV: 83.8 fL (ref 78.0–100.0)
MCV: 84.7 fL (ref 78.0–100.0)
Platelets: 272 10*3/uL (ref 150–400)
RBC: 5.47 MIL/uL (ref 4.22–5.81)
RDW: 14.3 % (ref 11.5–15.5)
RDW: 13.3 % (ref 11.5–15.5)
WBC: 6.4 10*3/uL (ref 4.0–10.5)
WBC: 9.2 10*3/uL (ref 4.0–10.5)

## 2010-08-14 LAB — URINALYSIS, ROUTINE W REFLEX MICROSCOPIC
Leukocytes, UA: NEGATIVE
Protein, ur: 30 mg/dL — AB
Urobilinogen, UA: 0.2 mg/dL (ref 0.0–1.0)

## 2010-08-14 LAB — CBC WITH DIFFERENTIAL/PLATELET
Basophils Absolute: 0 10*3/uL (ref 0.0–0.1)
HCT: 44.9 % (ref 39.0–52.0)
Lymphs Abs: 2.1 10*3/uL (ref 0.7–4.0)
MCV: 88.7 fl (ref 78.0–100.0)
Monocytes Absolute: 0.4 10*3/uL (ref 0.1–1.0)
Monocytes Relative: 6.3 % (ref 3.0–12.0)
Platelets: 212 10*3/uL (ref 150.0–400.0)
RDW: 14.6 % (ref 11.5–14.6)

## 2010-08-14 LAB — LIPID PANEL
Cholesterol: 137 mg/dL (ref 0–200)
HDL: 38.9 mg/dL — ABNORMAL LOW (ref >39.00)
VLDL: 15.6 mg/dL (ref 0.0–40.0)

## 2010-08-14 LAB — POCT I-STAT 4, (NA,K, GLUC, HGB,HCT)
Glucose, Bld: 107 mg/dL — ABNORMAL HIGH (ref 70–99)
HCT: 46 % (ref 39.0–52.0)

## 2010-08-14 LAB — URINE MICROSCOPIC-ADD ON

## 2010-08-14 LAB — URINALYSIS
Bilirubin Urine: NEGATIVE
Hgb urine dipstick: NEGATIVE
Total Protein, Urine: NEGATIVE
Urine Glucose: NEGATIVE

## 2010-08-14 LAB — TSH: TSH: 1.76 u[IU]/mL (ref 0.35–5.50)

## 2010-08-14 LAB — HEPATIC FUNCTION PANEL: Total Bilirubin: 0.8 mg/dL (ref 0.3–1.2)

## 2010-08-19 NOTE — Progress Notes (Signed)
Summary: meds question/refill?  Phone Note Call from Patient Call back at Home Phone [phone] [facility]     Reason for Call: Refill Medication Summary of Call: request refill on TRIAMTERENE-HCTZ 37.5-25 MG CAPS (TRIAMTERENE-HCTZ) one tab by mouth once daily, or does he still need to take it? If still needs to be on meds, please send to [facility] Initial call taken by: Migdalia Dk,  August 14, 2010 8:03 AM  Follow-up for Phone Call        ok to fill as in 05/2010 - we can discuss further if med is needed at his next ov - thx Follow-up by: Newt Lukes MD,  August 14, 2010 8:16 AM  Additional Follow-up for Phone Call Additional follow up Details #1::        Notified pt with md response. Sent med to [facility] Additional Follow-up by: Orlan Leavens RMA,  August 14, 2010 8:26 AM    Prescriptions: TRIAMTERENE-HCTZ 37.5-25 MG CAPS (TRIAMTERENE-HCTZ) one tab by mouth once daily  #90 Capsule x 0   Entered by:   Orlan Leavens RMA   Authorized by:   Newt Lukes MD   Signed by:   Orlan Leavens RMA on 08/14/2010   Method used:   Electronically to        [facility]* (retail)       [address]       [address] Shipping/mailing       [place], [place]  [address]       Ph: [phone]       Fax: [phone]   RxID:   [id number]

## 2010-08-20 ENCOUNTER — Encounter: Payer: Self-pay | Admitting: *Deleted

## 2010-08-22 ENCOUNTER — Encounter: Payer: Self-pay | Admitting: Internal Medicine

## 2010-08-25 ENCOUNTER — Ambulatory Visit (INDEPENDENT_AMBULATORY_CARE_PROVIDER_SITE_OTHER): Payer: Commercial Managed Care - PPO | Admitting: Internal Medicine

## 2010-08-25 ENCOUNTER — Other Ambulatory Visit (INDEPENDENT_AMBULATORY_CARE_PROVIDER_SITE_OTHER): Payer: Commercial Managed Care - PPO

## 2010-08-25 ENCOUNTER — Telehealth: Payer: Self-pay | Admitting: Internal Medicine

## 2010-08-25 ENCOUNTER — Encounter: Payer: Self-pay | Admitting: Internal Medicine

## 2010-08-25 VITALS — BP 132/80 | HR 68 | Temp 98.6°F | Ht 64.0 in | Wt 196.4 lb

## 2010-08-25 DIAGNOSIS — Z0000 Encounter for general adult medical examination without abnormal findings: Secondary | ICD-10-CM

## 2010-08-25 DIAGNOSIS — K219 Gastro-esophageal reflux disease without esophagitis: Secondary | ICD-10-CM

## 2010-08-25 DIAGNOSIS — I10 Essential (primary) hypertension: Secondary | ICD-10-CM

## 2010-08-25 DIAGNOSIS — E785 Hyperlipidemia, unspecified: Secondary | ICD-10-CM

## 2010-08-25 DIAGNOSIS — Z136 Encounter for screening for cardiovascular disorders: Secondary | ICD-10-CM

## 2010-08-25 DIAGNOSIS — H04129 Dry eye syndrome of unspecified lacrimal gland: Secondary | ICD-10-CM

## 2010-08-25 MED ORDER — OMEGA 3 1000 MG PO CAPS: 1.0000 | ORAL_CAPSULE | Freq: Three times a day (TID) | ORAL | Status: DC

## 2010-08-25 NOTE — Assessment & Plan Note (Signed)
The current medical regimen is effective;  continue present plan and medications.  May be able to wean off treatemnt with continued weight loss efforts - will follow BP Readings from Last 3 Encounters:  08/25/10 132/80  03/03/10 132/92  01/28/10 142/98

## 2010-08-25 NOTE — Telephone Encounter (Signed)
Please call patient - normal testosterone results. No medication changes recommended. Thanks.

## 2010-08-25 NOTE — Progress Notes (Signed)
Subjective:    Patient ID: Brad Barrera, male    DOB: 1971/02/24, 40 y.o.   MRN: [id number]  HPI patient is here today for annual physical. Patient feels well. Reports optho would like his testosterone level checked due to dry eye syndrome  Also reviewed other chronic medical issues:  Obesity - status post lap band 03/2010 -  Wt Readings from Last 3 Encounters:  08/25/10 196 lb 6.4 oz (89.086 kg)  03/03/10 265 lb 12.8 oz (120.566 kg)  01/28/10 271 lb 12.8 oz (123.288 kg)   HTN - the patient reports compliance with medication(s) as prescribed. Denies adverse side effects.  Dyslipidemia - the patient reports unable to swallow omega 3 due to size of pills. Improved with weight loss  Past Medical History  Diagnosis Date  . HYPERTENSION 06/07/2008  . Morbid obesity 06/07/2008  . GERD 06/07/2008  . HYPERLIPIDEMIA 07/11/2008  . Anal fissure 12/31/2008  . NEPHROLITHIASIS 06/07/2008   Family History  Problem Relation Age of Onset  . Coronary artery disease Mother   . Heart attack Mother   . Hypertension Father   . Heart attack Father   . Prostate cancer Father   . Atrial fibrillation Father   . Hypertension Sister    History  Substance Use Topics  . Smoking status: Never Smoker   . Smokeless tobacco: Not on file  . Alcohol Use: Yes   Review of Systems  Constitutional: Negative for fever.  Respiratory: Negative for cough and shortness of breath.   Cardiovascular: Negative for chest pain.  Gastrointestinal: Negative for abdominal pain.  Musculoskeletal: Negative for gait problem.  Skin: Negative for rash.  Neurological: Negative for dizziness.  No other specific complaints in a complete review of systems (except as listed in HPI above).     Objective:   Physical Exam BP 132/80  Pulse 68  Temp(Src) 98.6 F (37 C) (Oral)  Ht 5\' 4"  (1.626 m)  Wt 196 lb 6.4 oz (89.086 kg)  BMI 33.71 kg/m2  Physical Exam  Constitutional:  oriented to person, place, and time. appears  well-developed and well-nourished. No distress.  HEENT: NCAT; hearing grossly normal and TMs B clear; MMM and OP clear; PERRL, EOMI, no icterus or injection, vision grossly intact Neck: Normal range of motion. Neck supple. No JVD present. No thyromegaly present.  Cardiovascular: Normal rate, regular rhythm and normal heart sounds.  No murmur heard. Pulmonary/Chest: Effort normal and breath sounds normal. No respiratory distress. no wheezes.  Abdominal: Soft. Bowel sounds are normal. Patient exhibits no distension. There is no tenderness.  Musculoskeletal: Normal range of motion. Patient exhibits no edema.  Neurological: he is alert and oriented to person, place, and time. No cranial nerve deficit. Coordination normal.  Skin: Skin is warm and dry. Candida rash noted at posterior neck fold. No erythema or ulceration.  Psychiatric: he has a normal mood and affect. behavior is normal. Judgment and thought content normal.  GU: circumcised, no penile lesions or discharge, bilaterally normally descended testicles without mass or tenderness  Lab Results  Component Value Date   WBC 6.3 08/14/2010   HGB 15.3 08/14/2010   HCT 44.9 08/14/2010   PLT 212.0 08/14/2010   CHOL 137 08/14/2010   TRIG 78.0 08/14/2010   HDL 38.90* 08/14/2010   ALT 25 08/14/2010   AST 19 08/14/2010   NA 137 08/14/2010   K 4.4 08/14/2010   CL 100 08/14/2010   CREATININE 0.8 08/14/2010   BUN 23 08/14/2010   CO2 31 08/14/2010  TSH 1.76 08/14/2010   PSA 0.41 08/14/2010   HGBA1C 5.1 12/28/2008   EKG reviewed - NSR, no arrythmia or ischemia.      Assessment & Plan:  See problem list. Medications and labs reviewed today.

## 2010-08-25 NOTE — Patient Instructions (Signed)
It was good to see you today. Exam, labs and EKG look great today. Great job on the weight loss! Keep up the good work! Omega 3 as discussed for dry eye and low HDL - will look for liquid form but not aware of any at this time other than breaking capsule open. Test(s) ordered today. Your results will be called to you after review (48-72hours after test completion). If any changes need to be made, you will be notified at that time. Please schedule followup in 6 months to monitor blood pressure and medication review, call sooner if problems.

## 2010-08-25 NOTE — Assessment & Plan Note (Signed)
Spoke with dr. Carlynn Purl via phone today re: same - using OTC optho gtts and rec omega 3 supplements bid Also requests check testosterone levels - will do so now

## 2010-08-25 NOTE — Assessment & Plan Note (Signed)
Patient has been counseled on age-appropriate routine health concerns for screening and prevention. These are reviewed and up-to-date. Immunizations are up-to-date or declined. Labs and ECG reviewed.  

## 2010-08-25 NOTE — Assessment & Plan Note (Signed)
symptoms resolved with weight loss - no longer needing nexium

## 2010-08-26 NOTE — Telephone Encounter (Signed)
Pt Notified with lab results.Marland KitchenMarland Kitchen3/27/12@9 :11am/LMB

## 2010-09-02 ENCOUNTER — Other Ambulatory Visit: Payer: Self-pay | Admitting: Internal Medicine

## 2010-09-04 LAB — DIFFERENTIAL
Basophils Absolute: 0.1 10*3/uL (ref 0.0–0.1)
Basophils Relative: 1 % (ref 0–1)
Eosinophils Relative: 1 % (ref 0–5)
Monocytes Absolute: 0.4 10*3/uL (ref 0.1–1.0)
Monocytes Relative: 6 % (ref 3–12)

## 2010-09-04 LAB — POCT I-STAT 3, ART BLOOD GAS (G3+)
O2 Saturation: 95 %
TCO2: 26 mmol/L (ref 0–100)
pCO2 arterial: 36.9 mmHg (ref 35.0–45.0)
pH, Arterial: 7.443 (ref 7.350–7.450)
pO2, Arterial: 71 mmHg — ABNORMAL LOW (ref 80.0–100.0)

## 2010-09-04 LAB — CBC
HCT: 43.8 % (ref 39.0–52.0)
Hemoglobin: 15.1 g/dL (ref 13.0–17.0)
MCHC: 34.5 g/dL (ref 30.0–36.0)
RBC: 5.07 MIL/uL (ref 4.22–5.81)
RDW: 14.1 % (ref 11.5–15.5)

## 2010-09-04 LAB — BASIC METABOLIC PANEL
CO2: 25 mEq/L (ref 19–32)
Chloride: 101 mEq/L (ref 96–112)
GFR calc Af Amer: >60 mL/min (ref >60)
Glucose, Bld: 87 mg/dL (ref 70–99)
Potassium: 3.9 mEq/L (ref 3.5–5.1)
Sodium: 136 mEq/L (ref 135–145)

## 2010-09-04 LAB — POCT CARDIAC MARKERS: CKMB, poc: 1.1 ng/mL (ref 1.0–8.0)

## 2010-09-23 ENCOUNTER — Encounter: Payer: Commercial Managed Care - PPO | Attending: Surgery | Admitting: *Deleted

## 2010-09-23 DIAGNOSIS — Z713 Dietary counseling and surveillance: Secondary | ICD-10-CM | POA: Insufficient documentation

## 2010-09-23 DIAGNOSIS — Z9884 Bariatric surgery status: Secondary | ICD-10-CM | POA: Insufficient documentation

## 2010-09-23 DIAGNOSIS — Z09 Encounter for follow-up examination after completed treatment for conditions other than malignant neoplasm: Secondary | ICD-10-CM | POA: Insufficient documentation

## 2010-10-07 ENCOUNTER — Encounter (INDEPENDENT_AMBULATORY_CARE_PROVIDER_SITE_OTHER): Payer: Self-pay | Admitting: Surgery

## 2010-10-30 ENCOUNTER — Other Ambulatory Visit: Payer: Self-pay | Admitting: Internal Medicine

## 2010-10-31 HISTORY — PX: CHOLECYSTECTOMY: SHX55

## 2010-11-01 ENCOUNTER — Emergency Department (HOSPITAL_COMMUNITY): Payer: 59

## 2010-11-01 ENCOUNTER — Emergency Department (HOSPITAL_COMMUNITY)
Admission: EM | Admit: 2010-11-01 | Discharge: 2010-11-02 | Disposition: A | Discharge status: Home / Self Care | Payer: 59 | Attending: Emergency Medicine | Admitting: Emergency Medicine

## 2010-11-01 DIAGNOSIS — Z79899 Other long term (current) drug therapy: Secondary | ICD-10-CM | POA: Insufficient documentation

## 2010-11-01 DIAGNOSIS — I10 Essential (primary) hypertension: Secondary | ICD-10-CM | POA: Insufficient documentation

## 2010-11-01 DIAGNOSIS — R112 Nausea with vomiting, unspecified: Secondary | ICD-10-CM | POA: Insufficient documentation

## 2010-11-01 DIAGNOSIS — R1013 Epigastric pain: Secondary | ICD-10-CM | POA: Insufficient documentation

## 2010-11-01 LAB — COMPREHENSIVE METABOLIC PANEL
ALT: 44 U/L (ref 0–53)
AST: 55 U/L — ABNORMAL HIGH (ref 0–37)
Alkaline Phosphatase: 76 U/L (ref 39–117)
CO2: 30 mEq/L (ref 19–32)
Calcium: 9.3 mg/dL (ref 8.4–10.5)
Chloride: 95 mEq/L — ABNORMAL LOW (ref 96–112)
GFR calc Af Amer: >60 mL/min (ref >60)
GFR calc non Af Amer: >60 mL/min (ref >60)
Glucose, Bld: 100 mg/dL — ABNORMAL HIGH (ref 70–99)
Potassium: 3.7 mEq/L (ref 3.5–5.1)
Sodium: 134 mEq/L — ABNORMAL LOW (ref 135–145)
Total Bilirubin: 0.8 mg/dL (ref 0.3–1.2)

## 2010-11-01 LAB — CK TOTAL AND CKMB (NOT AT ARMC): Total CK: 131 U/L (ref 7–232)

## 2010-11-01 LAB — CBC
HCT: 45.4 % (ref 39.0–52.0)
Hemoglobin: 15.7 g/dL (ref 13.0–17.0)
MCHC: 34.6 g/dL (ref 30.0–36.0)
RBC: 5.22 MIL/uL (ref 4.22–5.81)

## 2010-11-01 LAB — DIFFERENTIAL
Basophils Absolute: 0 10*3/uL (ref 0.0–0.1)
Basophils Relative: 0 % (ref 0–1)
Lymphocytes Relative: 21 % (ref 12–46)
Monocytes Absolute: 0.7 10*3/uL (ref 0.1–1.0)
Neutro Abs: 7.2 10*3/uL (ref 1.7–7.7)
Neutrophils Relative %: 71 % (ref 43–77)

## 2010-11-01 LAB — URINALYSIS, ROUTINE W REFLEX MICROSCOPIC
Protein, ur: NEGATIVE mg/dL
Urobilinogen, UA: 0.2 mg/dL (ref 0.0–1.0)

## 2010-11-02 LAB — TROPONIN I: Troponin I: <0.3 ng/mL (ref <0.30)

## 2010-11-02 LAB — CK TOTAL AND CKMB (NOT AT ARMC): CK, MB: 2.8 ng/mL (ref 0.3–4.0)

## 2010-11-06 ENCOUNTER — Inpatient Hospital Stay (HOSPITAL_COMMUNITY)
Admission: AD | Admit: 2010-11-06 | Discharge: 2010-11-08 | DRG: 419 | Disposition: A | Discharge status: Home / Self Care | Payer: 59 | Source: Ambulatory Visit | Attending: Surgery | Admitting: Surgery

## 2010-11-06 DIAGNOSIS — I10 Essential (primary) hypertension: Secondary | ICD-10-CM | POA: Diagnosis present

## 2010-11-06 DIAGNOSIS — Z87442 Personal history of urinary calculi: Secondary | ICD-10-CM

## 2010-11-06 DIAGNOSIS — K8000 Calculus of gallbladder with acute cholecystitis without obstruction: Principal | ICD-10-CM | POA: Diagnosis present

## 2010-11-06 DIAGNOSIS — K828 Other specified diseases of gallbladder: Secondary | ICD-10-CM | POA: Diagnosis present

## 2010-11-06 DIAGNOSIS — Z9884 Bariatric surgery status: Secondary | ICD-10-CM

## 2010-11-06 LAB — CBC
HCT: 42.8 % (ref 39.0–52.0)
Hemoglobin: 14.5 g/dL (ref 13.0–17.0)
RDW: 13.5 % (ref 11.5–15.5)
WBC: 13.5 10*3/uL — ABNORMAL HIGH (ref 4.0–10.5)

## 2010-11-06 LAB — COMPREHENSIVE METABOLIC PANEL
ALT: 107 U/L — ABNORMAL HIGH (ref 0–53)
AST: 140 U/L — ABNORMAL HIGH (ref 0–37)
Alkaline Phosphatase: 99 U/L (ref 39–117)
GFR calc Af Amer: >60 mL/min (ref >60)
Glucose, Bld: 135 mg/dL — ABNORMAL HIGH (ref 70–99)
Potassium: 2.9 mEq/L — ABNORMAL LOW (ref 3.5–5.1)
Sodium: 133 mEq/L — ABNORMAL LOW (ref 135–145)
Total Protein: 7.1 g/dL (ref 6.0–8.3)

## 2010-11-06 LAB — DIFFERENTIAL
Basophils Absolute: 0 10*3/uL (ref 0.0–0.1)
Basophils Relative: 0 % (ref 0–1)
Eosinophils Relative: 0 % (ref 0–5)
Lymphocytes Relative: 14 % (ref 12–46)
Neutro Abs: 10.8 10*3/uL — ABNORMAL HIGH (ref 1.7–7.7)

## 2010-11-06 LAB — AMYLASE: Amylase: 48 U/L (ref 0–105)

## 2010-11-07 ENCOUNTER — Other Ambulatory Visit (INDEPENDENT_AMBULATORY_CARE_PROVIDER_SITE_OTHER): Payer: Self-pay | Admitting: Surgery

## 2010-11-07 ENCOUNTER — Inpatient Hospital Stay (HOSPITAL_COMMUNITY): Payer: 59

## 2010-11-08 LAB — COMPREHENSIVE METABOLIC PANEL
Albumin: 3 g/dL — ABNORMAL LOW (ref 3.5–5.2)
Alkaline Phosphatase: 79 U/L (ref 39–117)
BUN: 7 mg/dL (ref 6–23)
CO2: 32 mEq/L (ref 19–32)
Chloride: 101 mEq/L (ref 96–112)
Creatinine, Ser: 0.75 mg/dL (ref 0.4–1.5)
GFR calc Af Amer: >60 mL/min (ref >60)
GFR calc non Af Amer: >60 mL/min (ref >60)
Glucose, Bld: 95 mg/dL (ref 70–99)
Potassium: 3.9 mEq/L (ref 3.5–5.1)
Total Bilirubin: 0.7 mg/dL (ref 0.3–1.2)

## 2010-11-10 NOTE — Op Note (Signed)
NAMEAMEDIO, BOWLBY NO.:  [id number]  MEDICAL RECORD NO.:  [id number]  LOCATION:  1536                         FACILITY:  [facility]  PHYSICIAN:  Sandria Bales. Ezzard Standing, M.D.  DATE OF BIRTH:  1970-07-01  DATE OF PROCEDURE:  11/07/2010                               OPERATIVE REPORT  PREOPERATIVE DIAGNOSIS:  Acute cholecystitis with cholelithiasis.  POSTOPERATIVE DIAGNOSIS:  Acute edematous cholecystitis with cholelithiasis and abnormal cystic duct with a long duct that has a very short common duct.  PROCEDURE:  Laparoscopic cholecystectomy with intraoperative cholangiogram.  SURGEON:  Sandria Bales. Ezzard Standing, M.D.  FIRST ASSISTANT:  Brayton El, PA-C  ANESTHESIA:  General endotracheal with 20 cc of 0.25% Marcaine.  ESTIMATED BLOOD LOSS:  Minimal.  HISTORY OF PRESENT ILLNESS:  [name] is a 40 year old white male patient, patient of Dr. Rene Paci, who had a lap band by Dr. Ovidio Kin on 18 March 2010.  He successfully lost around 65 to 70 pounds to weight of about 185-190.  He has over the last couple of weeks had increasing epigastric right upper quadrant pain.  I saw him earlier this week in the office.  He called last night with severe abdominal pain for which I admitted him.  He has an ultrasound showing cholelithiasis.  I discussed with him about proceeding with cholecystectomy.  I discussed with him the indications, potential complications of cholecystectomy. The potential complications of cholecystectomy include but not limited to bleeding, infection which he may already have, open surgery, common bile duct injury.  I also discussed the he has a lap band and there is a chance that the infection from the gallbladder could get to the lap band both with and without surgery.  OPERATIVE NOTE:  The patient placed in supine position, given a general endotracheal anesthetic.  His abdomen was prepped with ChloraPrep and sterilely draped.    A time out  was held and the surgical check list run.  I accessed the abdominal cavity through an infraumbilical incision with sharp dissection carried down to the abdominal cavity.  A 0 degree 10 mm laparoscope was inserted through a 12 mm Hasson trocar and 12 mm Hasson trocar secured with a 0 Vicryl suture.  I placed a 10 mm in the subxiphoid location, 5 mm in the right mid subcostal and 5 mm in the right lateral subcostal location.   Right and left lobes of liver were unremarkable.  The stomach was unremarkable.  I could see the band tubing but I did not expose the band itself.  There was one adhesive band up to the anterior abdominal wall to exit port of the Lap-Band.  I took this down and because of bleeding from the omentum, placed a Vicryl Endoloop over this.  I then turned my attention to the gallbladder.  The gallbladder was acutely edematous consistent with acute edematous cholecystitis.  I decompressed the gallbladder with Nizhat sucker.  I then dissected out until I found the cystic duct gallbladder junction.  The cystic artery traveled almost anteriorly over the cystic duct and the gallbladder.  I divided that with 3 clips and identified the cystic duct.  We had to put a  clip on the gallbladder side of the cystic duct.  I then shot an intraoperative cholangiogram using half-strength Omnipaque. I used about 15 cc of Omnipaque.  I watched this under fluoroscopy.  I used a Taut catheter, inserted through a 14 gauge Jelco into the side of the cut cystic duct.  There was some stone debris in the cystic duct which I milked out of the cystic duct, placed the Taut catheter in the duct and secured with an Endo clip.  The intraoperative cholangiogram showed contrast going down a very long cystic duct that crossed the common bile duct and came into the common hepatic duct only with about maybe 1.5 cm common bile duct.  The common bile duct, the hepatic duct, the cystic duct all looked normal  and then the contrast emptied into the duodenum.  This was felt to be a normal intraoperative angiogram.  I then placed 3 clips on the cystic duct and I had already divided the cystic artery.  I bluntly and sharply dissected the gallbladder from the gallbladder bed.  The gallbladder here was noted be fairly edematous, actually was kind of swept out of the gallbladder bed, it was somewhat bloody.  I used Bovie electrocautery in the gallbladder bed.  I did revisualize the triangle of Calot.  I revisualized the gallbladder bed, there was no further bleeding.  I did place a piece of Surgicel in the gallbladder bed.  I placed the gallbladder in EndoCatch bag and delivered it through the umbilicus.  I then reinspected the abdomen.  I irrigated with about 4 L of saline.  This was in part due to some of the blood that had come off the bed.  Really we spilled very little the gallbladder contents but I was concerned about the band getting infected and this is not the reason for irrigation.  At the irrigation was complete, I reinspected the gallbladder bed which looked good.  There was no bleeding.  After that, I aspirated all fluid out.  I then pulled each trocar in turn the umbilical port coming out first.  I closed the fascia at the umbilicus with a 0 Vicryl suture.  I closed the skin at each site with a 5-0 Vicryl suture, painted the wound with Dermabond and sterilely dressed.    The patient tolerated the procedure well, was transported to recovery room in good condition. Sponge and needle counts were correct at the end of the case.   Sandria Bales. Ezzard Standing, M.D., FACS   DHN/MEDQ  D:  11/07/2010  T:  11/07/2010  Job:  [id number]  cc:   Vikki Ports A. Felicity Coyer, MD [address] [place], [place] [address]  Electronically Signed by Ovidio Kin M.D. on 11/10/2010 09:06:32 AM

## 2010-11-10 NOTE — H&P (Signed)
NAMENICKALAUS, CROOKE NO.:  [id number]  MEDICAL RECORD NO.:  [id number]  LOCATION:  1536                         FACILITY:  [facility]  PHYSICIAN:  Sandria Bales. Ezzard Standing, M.D.  DATE OF BIRTH:  1970-10-03  DATE OF ADMISSION:  11/06/2010                              HISTORY & PHYSICAL  HISTORY OF PRESENT ILLNESS:  Brad Barrera is a 40 year old white male who is a patient of Dr. Rene Paci.  I placed an  AP large lap band on him on March 18, 2010.  His initial weight was approximately 255 pounds.  He has now lost down to approximately 185 pounds for a 70-pound weight loss.  Unfortunately, he developed right back and right upper quadrant abdominal pain and came to the [facility] Emergency Room on November 01, 2010, where an evaluation showed gallstones.  His other labs are unremarkable except for mildly elevated SGOT to 55.  I saw him in the office this past week with anticipated gallbladder surgery in July, however, this evening about 7:00 p.m. he started having repeat attack of what he had about 5 days ago with right back and right upper quadrant pain.  He talked to me on the phone.  We agreed to make him a direct admit with planned gallbladder surgery in the next 24 hours at [facility].  He has had a history of gastroesophageal reflux that seem to have resolved with his weight loss and band surgery.  He has no known history of liver disease, pancreas disease or colon disease.  His labs are pending at the time of dictation.  REVIEW OF SYSTEMS:   NEUROLOGIC:  No seizure, loss of consciousness. PULMONARY:  History of history of pneumonia or tuberculosis.   CARDIAC: He had been hypertensive and on medicines for this, I think he is on hydrochlorothiazide.  PULMONARY:  He does have history of obstructive sleep apnea which actually resolved with his weight loss.   GASTROINTESTINAL:  He had some GERD before his band and weight loss but he does seem to  have resolved.  He has no known liver disease, pancreas disease  He has now documented gallbladder disease.   UROLOGIC:  He does have a history of kidney stones with his pain.  He has now clearly different from any kidney stone pain he had before.   MUSCULOSKELETAL:  No back or joint pain.  He works in the [employer]. He is accompanied by his friend, Onalee Hua.  PHYSICAL EXAMINATION:  VITAL SIGNS:  On physical exam, his blood pressure 169/99, pulse is 80, temperature is 97.8, weight approximately 184 pounds, he is 5 feet 6 inches. HEENT:  He is clearly uncomfortable, but does have panting breath, I tried to get him slow his breath down. HEENT:  Unremarkable. NECK:  Supple.  I feel no mass or thyromegaly. LUNGS:  His lungs are clear to auscultation with symmetric breath sounds. HEART:  His heart has a regular rate and rhythm.  He has no murmur or rub. ABDOMEN:  He is tender in the right upper quadrant.  He is holding his abdomen.  His knee flexed.  His band palpable with no hernia or mass.  EXTREMITIES:  He has good strength in upper lower extremities.  LABORATORY DATA:  Labs are pending at the time of dictation.  His partner is in the room with him.  IMPRESSION: 1. Acute gallbladder disease, possibly biliary colic.  We will plan IV     fluids, pain medicines and schedule him for surgery tomorrow. I discussed with him the indications and risks of surgery.  Risks include, but are not limited to, bleeding, infection, common bile duct injury, open surgery and with the lap band there is always a risk of infecting the band. 2. Status post lap band surgery with successful weight loss of 70     pounds. 3. Hypertension. 4. History of resolved sleep apnea. 5. Resolved gastroesophageal reflux disease.   Sandria Bales. Ezzard Standing, M.D., FACS   DHN/MEDQ  D:  11/06/2010  T:  11/06/2010  Job:  [id number]  cc:   Brad Ports A. Felicity Coyer, MD [address] [place], [place] [address]  Electronically Signed  by Ovidio Kin M.D. on 11/10/2010 09:00:21 AM

## 2010-11-12 ENCOUNTER — Telehealth: Payer: Self-pay | Admitting: *Deleted

## 2010-11-12 NOTE — Telephone Encounter (Signed)
Pt left message on triage to set up appointment for medical history forms for school and also F/U after emergency surgery with PCP. Pt transferred to scheduler to set up appointment.

## 2010-11-13 ENCOUNTER — Encounter: Payer: Self-pay | Admitting: Internal Medicine

## 2010-11-13 ENCOUNTER — Ambulatory Visit (INDEPENDENT_AMBULATORY_CARE_PROVIDER_SITE_OTHER): Payer: 59 | Admitting: Internal Medicine

## 2010-11-13 DIAGNOSIS — I10 Essential (primary) hypertension: Secondary | ICD-10-CM

## 2010-11-13 DIAGNOSIS — Z23 Encounter for immunization: Secondary | ICD-10-CM

## 2010-11-13 MED ORDER — MENINGOCOCCAL A C Y&W-135 CONJ IM INJ: 0.5000 mL | INJECTION | Freq: Once | INTRAMUSCULAR | Status: DC

## 2010-11-13 MED ORDER — VALACYCLOVIR HCL 1 G PO TABS: 1000.0000 mg | ORAL_TABLET | ORAL | Status: DC | PRN

## 2010-11-13 MED ORDER — FLUTICASONE PROPIONATE 50 MCG/ACT NA SUSP: 1.0000 | Freq: Every day | NASAL | Status: DC

## 2010-11-13 NOTE — Assessment & Plan Note (Signed)
Will stop med tx given weight loss and good control BP BP Readings from Last 3 Encounters:  11/13/10 120/82  08/25/10 132/80  03/03/10 132/92

## 2010-11-13 NOTE — Patient Instructions (Signed)
It was good to see you today. Forms completed for school today - meningococcal vaccine done today Stop blood pressure pill - cal if SBP>130s Refill on medication(s) as discussed today.

## 2010-11-13 NOTE — Assessment & Plan Note (Signed)
S/p lap band 03/2010 - starting weight 270# Doing fantastic Will stop blood pressure medications given weight loss and normalized BP

## 2010-11-13 NOTE — Progress Notes (Signed)
Subjective:    Patient ID: Brad Barrera, male    DOB: 03/08/1971, 40 y.o.   MRN: [id number]  HPI  patient is here today for completion of school forms  Also reviewed other chronic medical issues:  Obesity - status post lap band 03/2010 -  Wt Readings from Last 3 Encounters:  11/13/10 180 lb (81.647 kg)  08/25/10 196 lb 6.4 oz (89.086 kg)  03/03/10 265 lb 12.8 oz (120.566 kg)   HTN - the patient reports compliance with medication(s) as prescribed. Denies adverse side effects. Low K and low normal BP since weight loss - ?need to continue med?  Dyslipidemia - the patient reports unable to swallow omega 3 due to size of pills - taking liquid form. Improved with weight loss  Past Medical History  Diagnosis Date  . Morbid obesity     s/p lab band  . Anal fissure 12/31/2008  . GERD   . HYPERLIPIDEMIA   . HYPERTENSION   . NEPHROLITHIASIS    Family History  Problem Relation Age of Onset  . Coronary artery disease Mother   . Heart attack Mother   . Hypertension Father   . Heart attack Father   . Prostate cancer Father   . Atrial fibrillation Father   . Hypertension Sister    History  Substance Use Topics  . Smoking status: Never Smoker   . Smokeless tobacco: Not on file  . Alcohol Use: No   Review of Systems   Constitutional: Negative for fever.  Respiratory: Negative for cough and shortness of breath.   Cardiovascular: Negative for chest pain.  Gastrointestinal: Negative for abdominal pain.  Musculoskeletal: Negative for gait problem.  Skin: Negative for rash.  Neurological: Negative for dizziness.  No other specific complaints in a complete review of systems (except as listed in HPI above).     Objective:   Physical Exam  BP 120/82  Pulse 64  Temp(Src) 97.9 F (36.6 C) (Oral)  Ht 5\' 4"  (1.626 m)  Wt 180 lb (81.647 kg)  BMI 30.90 kg/m2  SpO2 97%  Physical Exam  Constitutional:  oriented to person, place, and time. appears well-developed and well-nourished.  No distress.  HEENT: NCAT; hearing grossly normal and TMs B clear; MMM and OP clear; PERRL, EOMI, no icterus or injection, vision grossly intact Neck: Normal range of motion. Neck supple. No JVD present. No thyromegaly present.  Cardiovascular: Normal rate, regular rhythm and normal heart sounds.  No murmur heard. Pulmonary/Chest: Effort normal and breath sounds normal. No respiratory distress. no wheezes.  Abdominal: Soft. Bowel sounds are normal. Patient exhibits no distension. There is no tenderness.  Musculoskeletal: Normal range of motion. Patient exhibits no edema.  Neurological: he is alert and oriented to person, place, and time. No cranial nerve deficit. Coordination normal.  Skin: Skin is warm and dry. Candida rash noted at posterior neck fold. No erythema or ulceration.  Psychiatric: he has a normal mood and affect. behavior is normal. Judgment and thought content normal.    Lab Results  Component Value Date   WBC 13.5* 11/06/2010   HGB 14.5 11/06/2010   HCT 42.8 11/06/2010   PLT 201 11/06/2010   CHOL 137 08/14/2010   TRIG 78.0 08/14/2010   HDL 38.90* 08/14/2010   ALT 196* 11/08/2010   AST 103* 11/08/2010   NA 137 11/08/2010   K 3.9 11/08/2010   CL 101 11/08/2010   CREATININE 0.75 11/08/2010   BUN 7 11/08/2010   CO2 32 11/08/2010   TSH  1.76 08/14/2010   PSA 0.41 08/14/2010   HGBA1C 5.1 12/28/2008   .    Assessment & Plan:   Completion of forms for school today - Time spent with pt today 25 minutes, greater than 50% time spent counseling patient on hypertension, weight and medication review. Also review of prior records  Also See problem list. Medications and labs reviewed today.

## 2010-11-28 ENCOUNTER — Encounter (INDEPENDENT_AMBULATORY_CARE_PROVIDER_SITE_OTHER): Payer: Self-pay | Admitting: Surgery

## 2010-11-28 ENCOUNTER — Ambulatory Visit (INDEPENDENT_AMBULATORY_CARE_PROVIDER_SITE_OTHER): Payer: Commercial Managed Care - PPO | Admitting: Surgery

## 2010-11-28 ENCOUNTER — Encounter (INDEPENDENT_AMBULATORY_CARE_PROVIDER_SITE_OTHER): Payer: Self-pay

## 2010-11-28 VITALS — Ht 64.0 in | Wt 180.0 lb

## 2010-11-28 DIAGNOSIS — K810 Acute cholecystitis: Secondary | ICD-10-CM

## 2010-11-28 NOTE — Patient Instructions (Signed)
You have done well from the gallbladder surgery.  I gave you copy of her pathology report.  You can make your next followup for lap band evaluation in 3-4 months.

## 2010-11-28 NOTE — Progress Notes (Signed)
HPI: The patient is a 40 year old white male who is a patient of Dr. Rene Paci. I placed a lap band in him on 18 March 2010. He has done well with his weight loss.  He presented with increasingly symptomatic cholecystitis. On 07 November 2010 I did a laparoscopic cholecystectomy and intraoperative cholangiogram.  He has done well since surgery with most of his symptoms resolving. He has also continued to do well with his weight loss. His weight is 180 pounds with a BMI approximately 30.  ROS: []   PE: Lungs: Clear to auscultation. Abdomen: His incisions are well healed and his abdomen is soft.  Data Reviewed: Pathology: Revealed acute and chronic cholecystitis. (I gave the patient a copy of the path report)  Assessement: #1. Acute cholecystitis, status post cholecystectomy. #2. Lap Band for morbid obesity. Successful weight loss of approximately 80 pounds.  #3. Sleep apnea which has resolved. #4. Gastroesophageal reflux disease which has resolved.   Plan: He has done well enough from his lap band to push his visits further apart. And we will see him back in 3-4 months in the office.

## 2010-12-23 ENCOUNTER — Encounter: Payer: 59 | Attending: Surgery | Admitting: *Deleted

## 2010-12-23 ENCOUNTER — Encounter: Payer: Self-pay | Admitting: *Deleted

## 2010-12-23 DIAGNOSIS — Z9884 Bariatric surgery status: Secondary | ICD-10-CM | POA: Insufficient documentation

## 2010-12-23 DIAGNOSIS — Z713 Dietary counseling and surveillance: Secondary | ICD-10-CM | POA: Insufficient documentation

## 2010-12-23 DIAGNOSIS — Z09 Encounter for follow-up examination after completed treatment for conditions other than malignant neoplasm: Secondary | ICD-10-CM | POA: Insufficient documentation

## 2010-12-23 NOTE — Patient Instructions (Signed)
  Goals:  Follow Phase 3B: High Protein + Non-Starchy Vegetables  Eat 3-6 small meals/snacks, every 3-5 hrs  Increase lean protein foods to meet 80g goal  Increase fluid intake to 64oz +  Add 15 grams of carbohydrate (fruit, whole grain, starchy vegetable) with meals  Avoid drinking 15 minutes before, during and 30 minutes after eating  Aim for >30 min of physical activity daily 

## 2010-12-23 NOTE — Progress Notes (Signed)
  Follow-up visit: 9 Months Post-Operative LAGB Surgery  Medical Nutrition Therapy:  Appt start time: 0900 end time:  0930.  Assessment:  Primary concerns today: post-operative bariatric surgery nutrition management.  Weight today: 184.1lb Weight change: 5.1 lbs Total weight lost: 79.8 lbs total (85.8 lbs on Brad Barrera's scale) BMI: 31.6% Weight goal: 170-180 lbs % Weight goal met: 89%  Dietary intake: Brad Barrera reports that he had added some carbohydrate foods to his diet but has not had a problem balancing his protein to carbohydrate intake and has not experienced and weight gain. He has an excellent understanding of his LAGB meal plan.  Fluid intake: 64 oz + Estimated total protein intake: >80g +  Medications: No BP medication! Supplementation: 100% compliance with ASMBS  Using straws: N/A Drinking while eating:N/A Hair loss: N/A Carbonated beverages: N/A N/V/D/C: N/A Last Lap-Band fill: 7.5 cc total in band per pt  Recent physical activity:  Working out 45-60 mins per day for 4-5 r  Progress Towards Goal(s):  In progress.     Intervention:    Follow Phase 3B: High Protein + Non-Starchy Vegetables  Eat 3-6 small meals/snacks, every 3-5 hrs  Increase lean protein foods to meet 80g goal  Increase fluid intake to 64oz +  Add 15 grams of carbohydrate (fruit, whole grain, starchy vegetable) with meals  Avoid drinking 15 minutes before, during and 30 minutes after eating  Aim for >30 min of physical activity daily  Monitoring/Evaluation:  Dietary intake, exercise, lap band fills, and body weight. Follow up in 3 months for 12 month post-op visit.

## 2011-01-02 ENCOUNTER — Encounter (INDEPENDENT_AMBULATORY_CARE_PROVIDER_SITE_OTHER): Payer: 59

## 2011-02-23 ENCOUNTER — Ambulatory Visit: Payer: Commercial Managed Care - PPO | Admitting: Internal Medicine

## 2011-03-04 ENCOUNTER — Emergency Department (HOSPITAL_BASED_OUTPATIENT_CLINIC_OR_DEPARTMENT_OTHER)
Admission: EM | Admit: 2011-03-04 | Discharge: 2011-03-04 | Disposition: A | Discharge status: Home / Self Care | Payer: 59 | Attending: Emergency Medicine | Admitting: Emergency Medicine

## 2011-03-04 ENCOUNTER — Emergency Department (INDEPENDENT_AMBULATORY_CARE_PROVIDER_SITE_OTHER): Payer: 59

## 2011-03-04 ENCOUNTER — Encounter (HOSPITAL_BASED_OUTPATIENT_CLINIC_OR_DEPARTMENT_OTHER): Payer: Self-pay | Admitting: *Deleted

## 2011-03-04 DIAGNOSIS — S5290XA Unspecified fracture of unspecified forearm, initial encounter for closed fracture: Secondary | ICD-10-CM | POA: Insufficient documentation

## 2011-03-04 DIAGNOSIS — Y9239 Other specified sports and athletic area as the place of occurrence of the external cause: Secondary | ICD-10-CM | POA: Insufficient documentation

## 2011-03-04 DIAGNOSIS — M25539 Pain in unspecified wrist: Secondary | ICD-10-CM

## 2011-03-04 DIAGNOSIS — Y92838 Other recreation area as the place of occurrence of the external cause: Secondary | ICD-10-CM | POA: Insufficient documentation

## 2011-03-04 DIAGNOSIS — M533 Sacrococcygeal disorders, not elsewhere classified: Secondary | ICD-10-CM

## 2011-03-04 MED ORDER — HYDROCODONE-ACETAMINOPHEN 5-325 MG PO TABS: 2.0000 | ORAL_TABLET | ORAL | Status: AC | PRN

## 2011-03-04 MED ORDER — OXYCODONE-ACETAMINOPHEN 5-325 MG PO TABS
1.0000 | ORAL_TABLET | Freq: Once | ORAL | Status: AC
  Administered 2011-03-04: 1 via ORAL
  Filled 2011-03-04: qty 1

## 2011-03-04 MED ORDER — IBUPROFEN 800 MG PO TABS: 800.0000 mg | ORAL_TABLET | Freq: Three times a day (TID) | ORAL | Status: AC

## 2011-03-04 NOTE — ED Provider Notes (Signed)
History     CSN: [id number] Arrival date & time: 03/04/2011  9:35 PM  Chief Complaint  Patient presents with  . Wrist Pain    (Consider location/radiation/quality/duration/timing/severity/associated sxs/prior treatment) HPI Comments: Patient presented with left wrist pain after a fall backwards while rollerskating onto an outstretched wrist. He denies hitting his head or losing consciousness. He did hit his buttocks as well has pain on his tailbone. He denies any weakness, numbness, tingling, fever, chest pain, abdominal pain, back pain. Sandbar bladder incontinence. He has pain with dorsi and palmar flexion of the wrist. He is able to move all of his fingers freely.  Patient is a 40 y.o. male presenting with wrist pain. The history is provided by the patient.  Wrist Pain This is a new problem. The current episode started 1 to 2 hours ago. The problem occurs constantly. The problem has not changed since onset.Pertinent negatives include no chest pain, no abdominal pain, no headaches and no shortness of breath. The symptoms are aggravated by nothing. The symptoms are relieved by ice.    Past Medical History  Diagnosis Date  . Morbid obesity     s/p lab band  . Anal fissure 12/31/2008  . GERD   . HYPERLIPIDEMIA   . HYPERTENSION   . NEPHROLITHIASIS     Past Surgical History  Procedure Date  . Tonsillectomy 1991  . Closed reduction wrist fracture     @ age 26  . Laparoscopic gastric banding 03/18/2010  . Cholecystectomy 10/2010    Family History  Problem Relation Age of Onset  . Coronary artery disease Mother   . Heart attack Mother   . Hypertension Father   . Heart attack Father   . Prostate cancer Father   . Atrial fibrillation Father   . Hypertension Sister     History  Substance Use Topics  . Smoking status: Never Smoker   . Smokeless tobacco: Not on file  . Alcohol Use: No      Review of Systems  Constitutional: Negative for fever, activity change and appetite  change.  HENT: Negative for neck pain.   Respiratory: Negative for cough and shortness of breath.   Cardiovascular: Negative for chest pain.  Gastrointestinal: Negative for nausea, vomiting and abdominal pain.  Genitourinary: Negative for dysuria and hematuria.  Musculoskeletal: Positive for arthralgias. Negative for back pain.  Neurological: Negative for headaches.  Psychiatric/Behavioral: Negative.     Allergies  Dilaudid; Other; Peanut-containing drug products; and Shellfish-derived products  Home Medications   Current Outpatient Rx  Name Route Sig Dispense Refill  . CALCIUM CITRATE 950 MG PO TABS Oral Take 1 tablet by mouth daily.      Marland Kitchen FISH OIL-CANOLA OIL-VIT D3 PO LIQD Oral Take 5 mLs by mouth daily.      Marland Kitchen FLUTICASONE PROPIONATE 50 MCG/ACT NA SUSP Nasal Place 1 spray into the nose daily as needed. Seasonal allergies     . LORATADINE 10 MG PO TABS Oral Take 10 mg by mouth daily as needed. For seasonal allergies    . ONE-DAILY MULTI VITAMINS PO TABS Oral Take 1 tablet by mouth daily.      Marland Kitchen VALACYCLOVIR HCL 1 G PO TABS Oral Take 1,000 mg by mouth as needed. For cold sores     . OMEGA 3 1000 MG PO CAPS Oral Take 1 capsule (1,000 mg total) by mouth 3 (three) times daily. 90 each 1    BP 147/96  Pulse 83  Temp(Src) 98.8 F (37.1 C) (  Oral)  Resp 16  Ht 5\' 6"  (1.676 m)  Wt 191 lb (86.637 kg)  BMI 30.83 kg/m2  SpO2 100%  Physical Exam  Constitutional: He is oriented to person, place, and time. He appears well-developed. No distress.  HENT:  Head: Normocephalic and atraumatic.  Mouth/Throat: Oropharynx is clear and moist. No oropharyngeal exudate.  Eyes: Conjunctivae and EOM are normal. Pupils are equal, round, and reactive to light.  Neck: Normal range of motion.       No C-spine pain  Cardiovascular: Normal rate, regular rhythm and normal heart sounds.   Pulmonary/Chest: Effort normal and breath sounds normal. No respiratory distress.  Abdominal: Soft. There is no  tenderness. There is no rebound and no guarding.  Musculoskeletal: He exhibits tenderness.       tender to palpation at left distal radial wrist. +2 radial pulse, cardinal hand movements intact. No pain in snuff box or pain with axial loading of the thumb. Mild swelling no obvious deformity Tenderness to palpation of sacrum and coccyx the midline  Neurological: He is alert and oriented to person, place, and time. No cranial nerve deficit.  Skin: Skin is warm.    ED Course  Procedures (including critical care time)  Labs Reviewed - No data to display Dg Sacrum/coccyx  03/04/2011  *RADIOLOGY REPORT*  Clinical Data: The patient fell while roller skating, injuring the sacrum and coccyx.  SACRUM AND COCCYX - 2+ VIEW  Comparison: Abdomen 11/01/2010  Findings: The sacroiliac joints appear symmetrical.  Sacral struts appear intact and symmetrical.  Normal alignment of the sacral coccygeal spine without displacement or cortical irregularity.  No focal bone lesion or bone destruction demonstrated.  IMPRESSION: No acute bony abnormalities demonstrated.  Original Report Authenticated By: Marlon Pel, M.D.   Dg Wrist Complete Left  03/04/2011  *RADIOLOGY REPORT*  Clinical Data: The patient fell while skating, injuring the left wrist.  Pain on the ulnar side.  LEFT WRIST - COMPLETE 3+ VIEW  Comparison: None.  Findings: Mild degenerative narrowing of the STT and first carpometacarpal joints.  There is a suggestion of a small cortical cleft along the dorsal aspect of the distal left radius seen only on the lateral view.  No corresponding abnormality on the other views and no soft tissue swelling demonstrated.  This is likely to represent a small vascular channel.  Nondisplaced fracture is not entirely excluded.  The left wrist bones appear otherwise intact. No evidence of subluxation.  IMPRESSION: Mild degenerative changes in the left wrist.  Small cortical irregularity on the dorsal surface of the distal  radius without associated displacement or soft tissue swelling, most likely nonacute.  No definite fracture demonstrated.  Original Report Authenticated By: Marlon Pel, M.D.     No diagnosis found.    MDM  Wrist and tailbone pain after fall. Neurovascularly intact.   Will obtain Xrays and treat pain.  Questionable cortical irregularity of distal radius on lateral view only. This does not appear to be acute per radiologist but this is the site of the patient's pain. Will treat as though fractured and splint with sugar tong.         Glynn Octave, MD 03/04/11 [phone]

## 2011-03-04 NOTE — ED Notes (Signed)
Pt c/o left wrist pain  Fall while roller skating x 1 hr ago

## 2011-03-18 ENCOUNTER — Ambulatory Visit: Payer: 59 | Admitting: *Deleted

## 2011-03-20 ENCOUNTER — Encounter (INDEPENDENT_AMBULATORY_CARE_PROVIDER_SITE_OTHER): Payer: 59 | Admitting: Surgery

## 2011-03-25 ENCOUNTER — Other Ambulatory Visit: Payer: Self-pay | Admitting: Internal Medicine

## 2011-04-01 ENCOUNTER — Encounter: Payer: 59 | Attending: Surgery | Admitting: *Deleted

## 2011-04-01 ENCOUNTER — Encounter: Payer: Self-pay | Admitting: *Deleted

## 2011-04-01 DIAGNOSIS — Z09 Encounter for follow-up examination after completed treatment for conditions other than malignant neoplasm: Secondary | ICD-10-CM | POA: Insufficient documentation

## 2011-04-01 DIAGNOSIS — Z9884 Bariatric surgery status: Secondary | ICD-10-CM | POA: Insufficient documentation

## 2011-04-01 DIAGNOSIS — Z713 Dietary counseling and surveillance: Secondary | ICD-10-CM | POA: Insufficient documentation

## 2011-04-01 NOTE — Patient Instructions (Signed)
Goals:  Follow Phase 3B: High Protein + Non-Starchy Vegetables  OR Restart Pre-Op Diet  Eat 3-6 small meals/snacks, every 3-5 hrs   Consume ~1/2 cup or 15 grams of carbohydrate (fruit, whole grain, starchy vegetable) with meals  Avoid drinking 15 minutes before, during and 30 minutes after eating  Aim for >30 min of physical activity daily

## 2011-04-01 NOTE — Progress Notes (Addendum)
  Follow-up visit: 12 Months Post-Operative LAGB Surgery  Medical Nutrition Therapy:  Appt start time: 1535 end time:  1610.  Assessment:  Primary concerns today: post-operative bariatric surgery nutrition management. Jesusita Oka has experienced an 8 lb weight gain since his last visit at [facility] in April 2012. He notes that he has "over done" his carbohydrate intake and has experienced more stress and less exercise, especially over the last month since he has broke his arm. Jesusita Oka notes that he is out of work on Short Term Disability at this time. Jesusita Oka knows how to modify his diet to get back on track and notes that he will be needing a band fill when he sees Dr. Ezzard Standing next week.  Weight today: 197.2 lb Weight change: 8 lb gain Total weight lost: 68.7 lbs total BMI: 33.9% Weight goal: 170-180 lbs  Surgery date: 03/19/11 Start weight at [facility]: 263.9 lbs  24-hr recall:  B (8-9 AM): Protein shake Snk (10  AM): Piece of fruit   L (12-1 PM): Hot and Sour Soup w/ egg roll Snk (3-4 PM): Protein shake  D (6-8 PM): Chicken, potato, bread Snk (PM): N/A  Fluid intake: water, protein drinks = 64 oz + Estimated total protein intake: 60-80g  Medications: See updated medication list Supplementation: Taking supplements regularly with no problems  Using straws: No Drinking while eating: No Hair loss: No Carbonated beverages: No N/V/D/C: None reported Last Lap-Band fill: Per Dr. Ezzard Standing, several fills to date  Recent physical activity:  Limited due to recent broken arm  Progress Towards Goal(s):  Some progress.  Handouts given during visit include:  Pre-Op Diet Handout   Nutritional Diagnosis:  NI-5.8.2 Excessive carbohydrate intake As related to increased intake of pasta, rice, bread, and sweets.  As evidenced by pt with slight weight gain s/p LAGB.    Intervention:  Nutrition education.  Monitoring/Evaluation:  Dietary intake, exercise, lap band fills, and body weight. Follow up in 3 months for 15  month post-op visit.

## 2011-04-02 ENCOUNTER — Ambulatory Visit (INDEPENDENT_AMBULATORY_CARE_PROVIDER_SITE_OTHER): Payer: Commercial Managed Care - PPO | Admitting: Surgery

## 2011-04-02 VITALS — BP 128/80 | HR 72 | Temp 97.7°F | Resp 18 | Ht 64.0 in | Wt 198.6 lb

## 2011-04-02 DIAGNOSIS — Z9884 Bariatric surgery status: Secondary | ICD-10-CM

## 2011-04-02 NOTE — Patient Instructions (Signed)
1.  Stay on liquids for 48 hours.  Then slowly start regular food back.
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
## 2011-06-08 ENCOUNTER — Ambulatory Visit: Payer: 59 | Admitting: *Deleted

## 2011-06-23 IMAGING — US US ABDOMEN COMPLETE
1 series · 13 of 25 positions shown · non-contrast
Comparison: Abdominal ultrasound performed 11/20/2009, and CT of
the abdomen and pelvis performed 02/07/2010

CLINICAL DATA: Abdominal pain.

ABDOMINAL ULTRASOUND COMPLETE

[Series 1: us abdomen complete · 0.34mm/px · 13 of 49 slices shown]
[im 1/49]
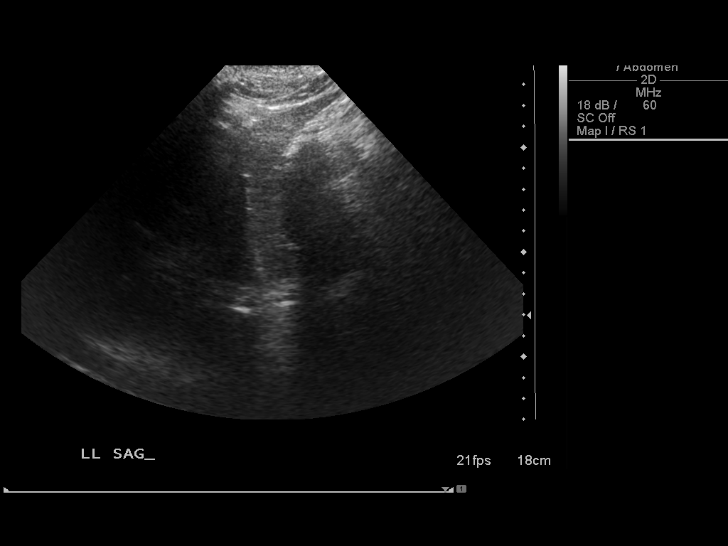
[im 5/49]
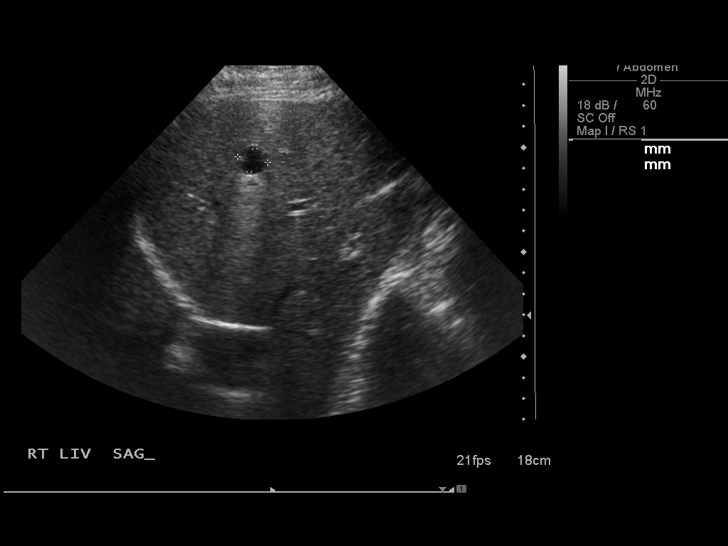
[im 9/49]
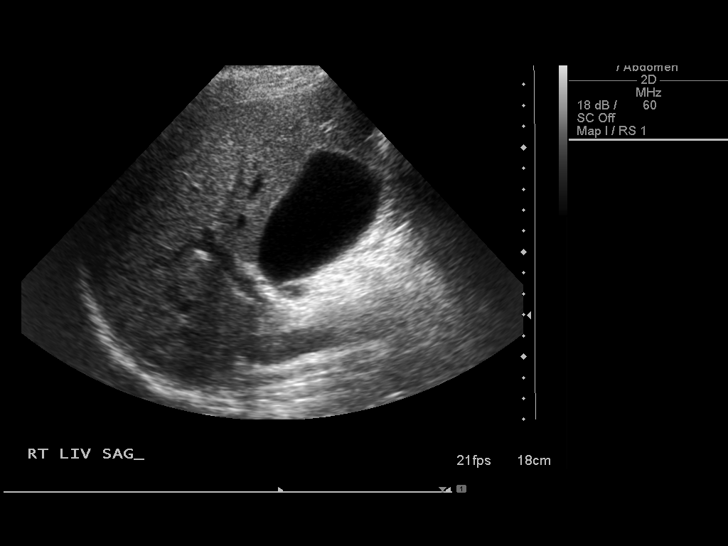
[im 13/49]
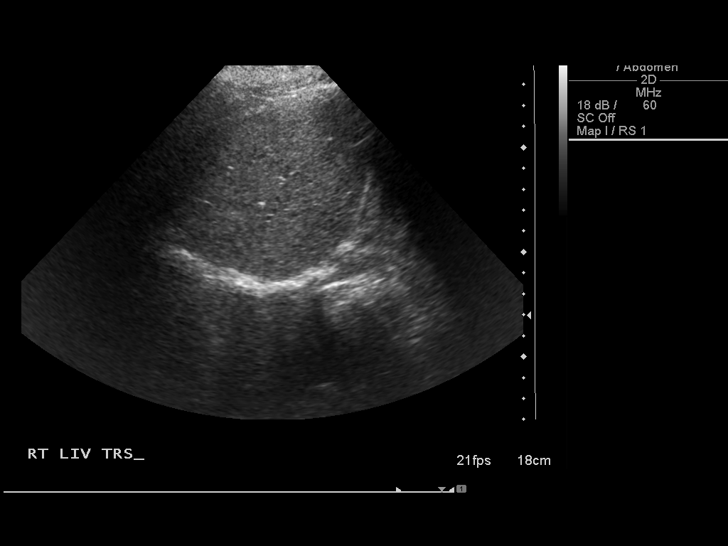
[im 17/49]
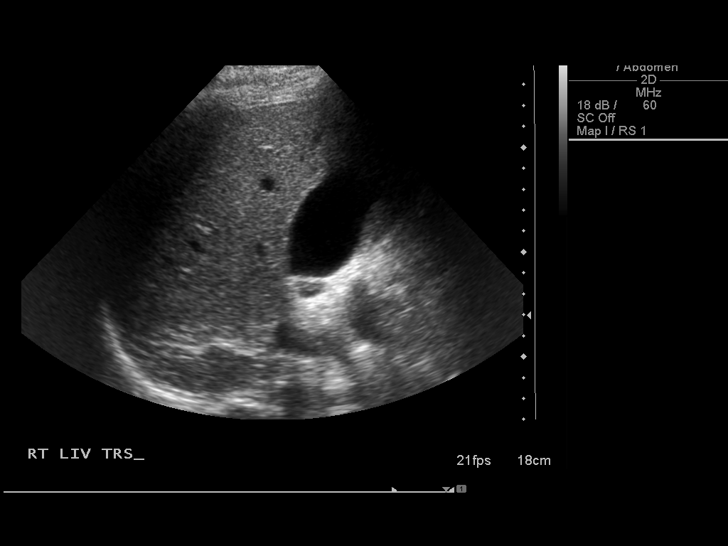
[im 21/49]
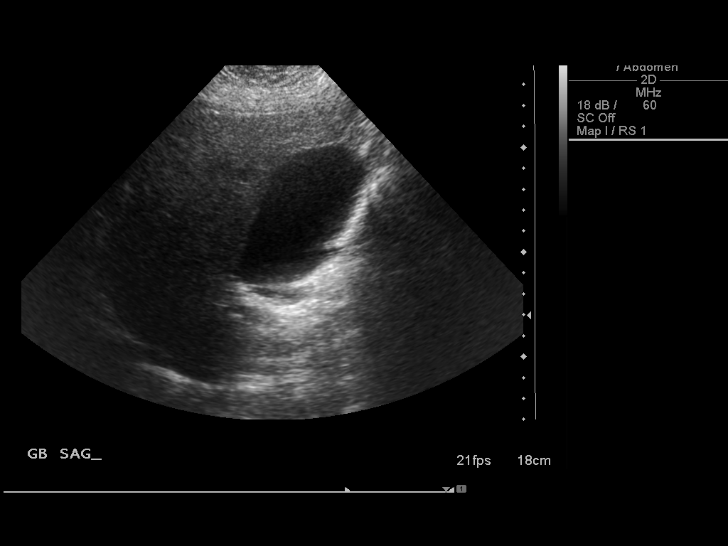
[im 25/49]
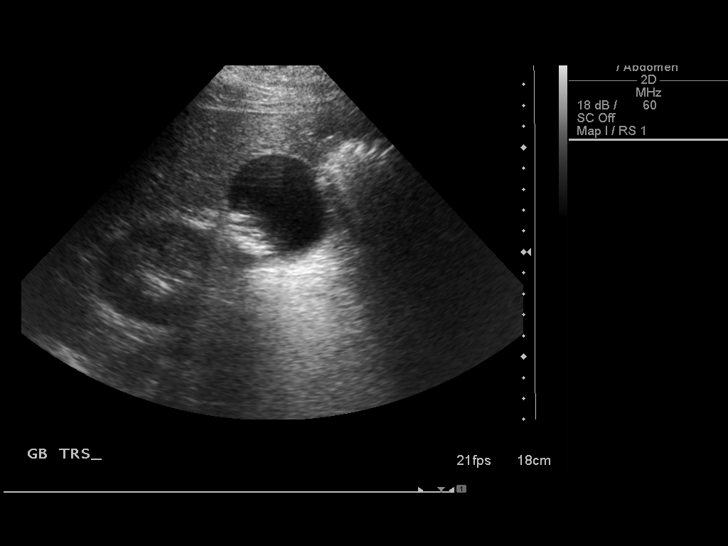
[im 29/49]
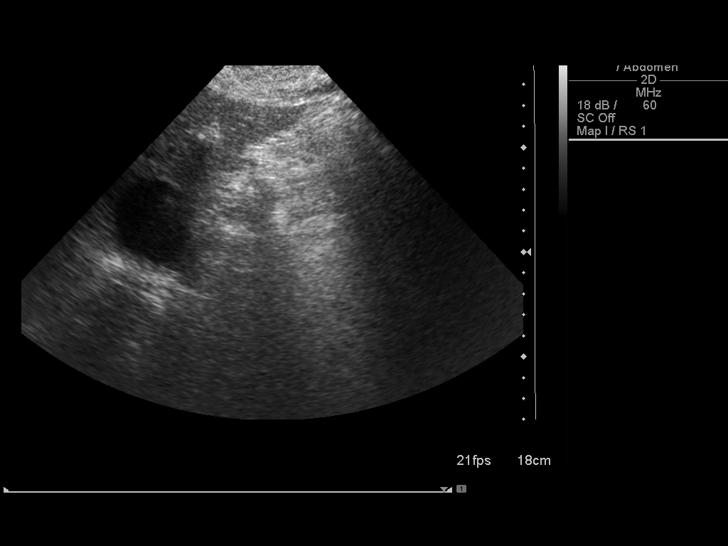
[im 33/49]
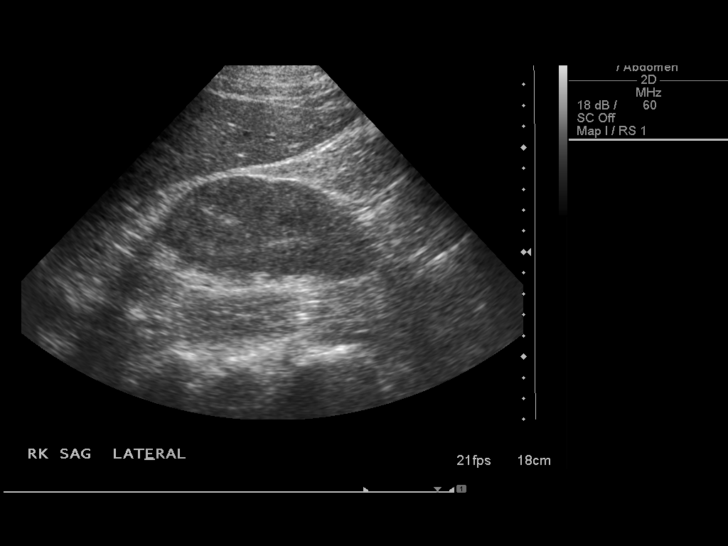
[im 37/49]
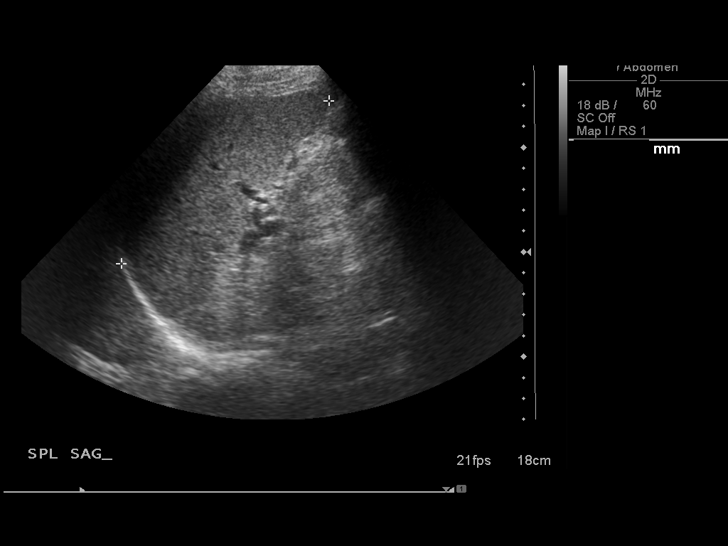
[im 41/49]
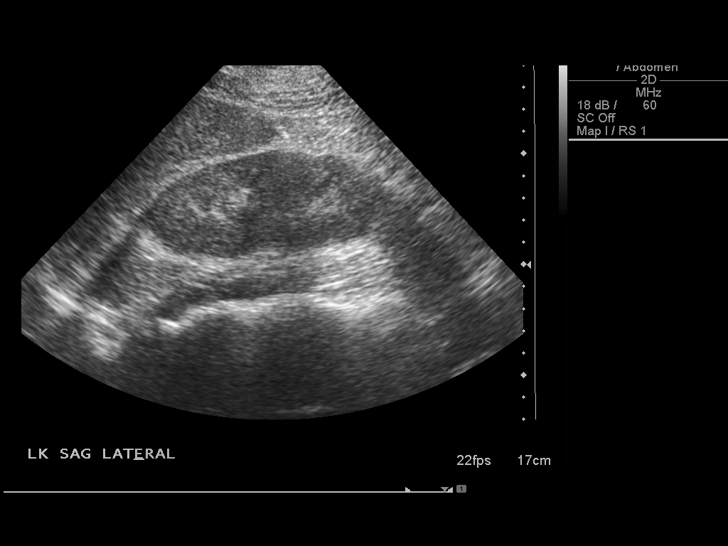
[im 45/49]
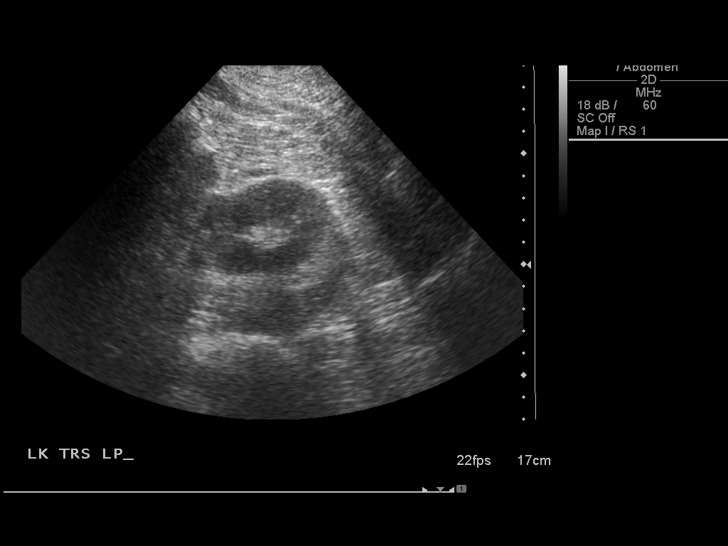
[im 49/49]
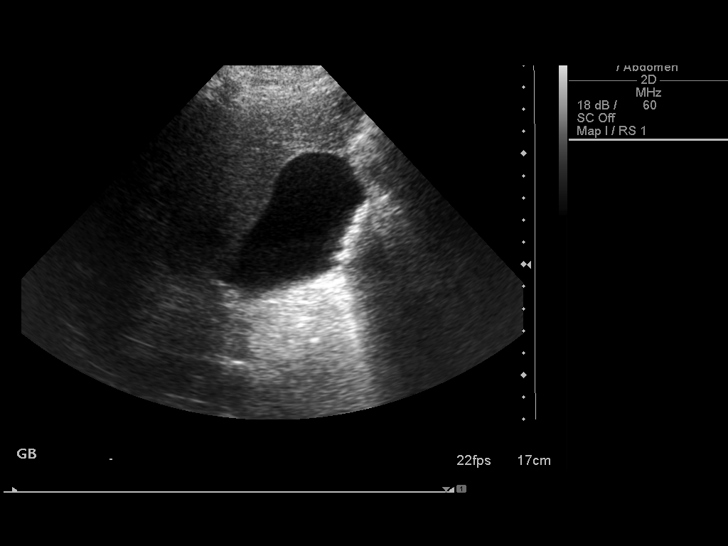

[13 of 25 positions shown; findings below may reference images not displayed]

FINDINGS: Gallbladder: Scattered small stones are seen layering dependently
within the gallbladder; these remain mobile on decubitus imaging.
No gallbladder wall thickening or pericholecystic fluid is seen to
suggest cholecystitis; there is no evidence for obstruction.  No
ultrasonographic Murphy's sign is seen.

Common Bile Duct:  0.4 cm in diameter; within normal limits in
caliber.

Liver:  Normal parenchymal echogenicity and echotexture; a single
1.5 x 1.3 x 1.4 cm cyst is noted within the right hepatic lobe,
demonstrating a thin septation.  Limited Doppler evaluation
demonstrates normal blood flow within the liver.

IVC:  Unremarkable in appearance.

Pancreas:  Although the pancreas is difficult to visualize in its
entirety due to overlying bowel gas, no focal pancreatic
abnormality is identified.

Spleen:  12.6 cm in length; within normal limits in size and
echotexture.

Right kidney:  12.4 cm in length; normal in size, configuration and
parenchymal echogenicity.  No evidence of mass or hydronephrosis.
A 0.4 cm stone is noted near the lower pole of the right kidney.

Left kidney:  12.9 cm in length; normal in size, configuration and
parenchymal echogenicity.  No evidence of mass or hydronephrosis.

Abdominal Aorta:  Normal in caliber; no aneurysm identified.  Not
fully characterized due to overlying bowel gas.
IMPRESSION: 1.  Cholelithiasis, without evidence for obstruction or
cholecystitis.
2.  1.5 cm hepatic cyst has grown mildly in size from prior
studies, with a single thin septation, but remains grossly benign
in appearance.
3.  0.4 cm stone noted within the right kidney.

## 2011-06-25 ENCOUNTER — Encounter (INDEPENDENT_AMBULATORY_CARE_PROVIDER_SITE_OTHER): Payer: Self-pay | Admitting: Surgery

## 2011-08-18 ENCOUNTER — Telehealth: Payer: Self-pay | Admitting: *Deleted

## 2011-08-18 DIAGNOSIS — Z125 Encounter for screening for malignant neoplasm of prostate: Secondary | ICD-10-CM

## 2011-08-18 DIAGNOSIS — Z0000 Encounter for general adult medical examination without abnormal findings: Secondary | ICD-10-CM

## 2011-08-18 DIAGNOSIS — Z114 Encounter for screening for human immunodeficiency virus [HIV]: Secondary | ICD-10-CM

## 2011-08-18 NOTE — Telephone Encounter (Signed)
Message copied by Deatra James on Tue Aug 18, 2011  9:24 AM ------      Message from: Etheleen Sia      Created: Tue Aug 18, 2011  8:43 AM      Regarding: PHYSICAL LABS AND HIV LAB       PHYSICAL ON April 8.  PLEASE ENTER CPX LABS.  PT IS REQUESTING HIV LAB ALSO.  CALL HIM IF THIS IS A PROBLEM.

## 2011-08-18 NOTE — Telephone Encounter (Signed)
Received staff msg pt has made cpx 09/07/11 need cpx labs entered. Pt is also requesting HIV testing is this ok?... 08/18/11 @ 9:25am/LMB

## 2011-08-18 NOTE — Telephone Encounter (Signed)
Ok - HIV under code v73.89 as added to prob list - other usual CPX labs under v70.0- thanks

## 2011-08-18 NOTE — Telephone Encounter (Signed)
Labs entered... 08/18/11@10 :31am/LMB

## 2011-08-26 ENCOUNTER — Ambulatory Visit (INDEPENDENT_AMBULATORY_CARE_PROVIDER_SITE_OTHER): Payer: 59 | Admitting: Internal Medicine

## 2011-08-26 ENCOUNTER — Ambulatory Visit (INDEPENDENT_AMBULATORY_CARE_PROVIDER_SITE_OTHER)
Admission: RE | Admit: 2011-08-26 | Discharge: 2011-08-26 | Disposition: A | Discharge status: Home / Self Care | Payer: 59 | Source: Ambulatory Visit | Attending: Internal Medicine | Admitting: Internal Medicine

## 2011-08-26 ENCOUNTER — Encounter: Payer: Self-pay | Admitting: Internal Medicine

## 2011-08-26 VITALS — BP 138/82 | HR 77 | Temp 98.5°F | Ht 62.0 in | Wt 205.0 lb

## 2011-08-26 DIAGNOSIS — M79609 Pain in unspecified limb: Secondary | ICD-10-CM

## 2011-08-26 DIAGNOSIS — M79672 Pain in left foot: Secondary | ICD-10-CM

## 2011-08-26 DIAGNOSIS — Z113 Encounter for screening for infections with a predominantly sexual mode of transmission: Secondary | ICD-10-CM

## 2011-08-26 MED ORDER — FLUTICASONE PROPIONATE 50 MCG/ACT NA SUSP: 1.0000 | Freq: Every day | NASAL | Status: DC | PRN

## 2011-08-26 NOTE — Patient Instructions (Signed)
It was good to see you today. xray ordered today. Your results will be called to you after review (48-72hours after test completion). If any changes need to be made, you will be notified at that time. we'll make referral to [place] [facility] for your foot. Our office will contact you regarding appointment(s) once made. Medications reviewed, no changes at this time. Refill on medication(s) as discussed today.
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
## 2011-08-27 ENCOUNTER — Other Ambulatory Visit (INDEPENDENT_AMBULATORY_CARE_PROVIDER_SITE_OTHER): Payer: 59

## 2011-08-27 DIAGNOSIS — Z113 Encounter for screening for infections with a predominantly sexual mode of transmission: Secondary | ICD-10-CM

## 2011-08-27 DIAGNOSIS — Z125 Encounter for screening for malignant neoplasm of prostate: Secondary | ICD-10-CM

## 2011-08-27 DIAGNOSIS — Z114 Encounter for screening for human immunodeficiency virus [HIV]: Secondary | ICD-10-CM

## 2011-08-27 DIAGNOSIS — Z0000 Encounter for general adult medical examination without abnormal findings: Secondary | ICD-10-CM

## 2011-08-27 LAB — HEPATIC FUNCTION PANEL
AST: 24 U/L (ref 0–37)
Albumin: 4.4 g/dL (ref 3.5–5.2)
Alkaline Phosphatase: 50 U/L (ref 39–117)
Bilirubin, Direct: 0.1 mg/dL (ref 0.0–0.3)
Total Protein: 7.5 g/dL (ref 6.0–8.3)

## 2011-08-27 LAB — CBC WITH DIFFERENTIAL/PLATELET
Basophils Relative: 0.7 % (ref 0.0–3.0)
Eosinophils Absolute: 0.1 10*3/uL (ref 0.0–0.7)
Hemoglobin: 16 g/dL (ref 13.0–17.0)
Lymphocytes Relative: 36.5 % (ref 12.0–46.0)
Monocytes Relative: 5.5 % (ref 3.0–12.0)
Neutro Abs: 3 10*3/uL (ref 1.4–7.7)
Neutrophils Relative %: 55.5 % (ref 43.0–77.0)
RBC: 5.22 Mil/uL (ref 4.22–5.81)
WBC: 5.3 10*3/uL (ref 4.5–10.5)

## 2011-08-27 LAB — LIPID PANEL
LDL Cholesterol: 92 mg/dL (ref 0–99)
Total CHOL/HDL Ratio: 3
VLDL: 18.2 mg/dL (ref 0.0–40.0)

## 2011-08-27 LAB — BASIC METABOLIC PANEL
BUN: 16 mg/dL (ref 6–23)
CO2: 32 mEq/L (ref 19–32)
Calcium: 9.3 mg/dL (ref 8.4–10.5)
Chloride: 102 mEq/L (ref 96–112)
Creatinine, Ser: 0.8 mg/dL (ref 0.4–1.5)

## 2011-08-27 LAB — URINALYSIS, ROUTINE W REFLEX MICROSCOPIC
Bilirubin Urine: NEGATIVE
Hgb urine dipstick: NEGATIVE
Ketones, ur: NEGATIVE
Nitrite: NEGATIVE
Total Protein, Urine: NEGATIVE
pH: 7 (ref 5.0–8.0)

## 2011-08-27 LAB — HIV ANTIBODY (ROUTINE TESTING W REFLEX): HIV: NONREACTIVE

## 2011-08-27 LAB — RPR

## 2011-08-27 LAB — PSA: PSA: 0.83 ng/mL (ref 0.10–4.00)

## 2011-09-07 ENCOUNTER — Ambulatory Visit (INDEPENDENT_AMBULATORY_CARE_PROVIDER_SITE_OTHER): Payer: 59 | Admitting: Internal Medicine

## 2011-09-07 ENCOUNTER — Encounter: Payer: Self-pay | Admitting: Internal Medicine

## 2011-09-07 ENCOUNTER — Other Ambulatory Visit: Payer: 59

## 2011-09-07 VITALS — BP 120/88 | HR 83 | Temp 98.7°F | Resp 16 | Ht 65.0 in | Wt 205.0 lb

## 2011-09-07 DIAGNOSIS — Z0000 Encounter for general adult medical examination without abnormal findings: Secondary | ICD-10-CM

## 2011-09-07 DIAGNOSIS — Z113 Encounter for screening for infections with a predominantly sexual mode of transmission: Secondary | ICD-10-CM

## 2011-09-07 MED ORDER — VALACYCLOVIR HCL 1 G PO TABS: 1000.0000 mg | ORAL_TABLET | ORAL | Status: DC | PRN

## 2011-09-07 NOTE — Progress Notes (Signed)
Subjective:    Patient ID: Brad Barrera, male    DOB: 03/16/1971, 41 y.o.   MRN: [id number]  HPI patient is here today for annual physical. Patient feels well and has no complaints.  Past Medical History  Diagnosis Date  . Morbid obesity     s/p lap band  . Anal fissure 12/31/2008  . GERD   . HYPERLIPIDEMIA   . HYPERTENSION   . NEPHROLITHIASIS    Family History  Problem Relation Age of Onset  . Coronary artery disease Mother   . Heart attack Mother   . Hypertension Father   . Heart attack Father   . Prostate cancer Father   . Atrial fibrillation Father   . Hypertension Sister    History  Substance Use Topics  . Smoking status: Never Smoker   . Smokeless tobacco: Not on file  . Alcohol Use: No    Review of Systems Constitutional: Negative for fever or weight change.  Respiratory: Negative for cough and shortness of breath.   Cardiovascular: Negative for chest pain or palpitations.  Gastrointestinal: Negative for abdominal pain, no bowel changes.  Musculoskeletal: Negative for gait problem or joint swelling.  Skin: Negative for rash.  Neurological: Negative for dizziness or headache.  No other specific complaints in a complete review of systems (except as listed in HPI above).     Objective:   Physical Exam BP 120/88  Pulse 83  Temp(Src) 98.7 F (37.1 C) (Oral)  Resp 16  Ht 5\' 5"  (1.651 m)  Wt 205 lb (92.987 kg)  BMI 34.11 kg/m2  SpO2 95% Wt Readings from Last 3 Encounters:  09/07/11 205 lb (92.987 kg)  08/26/11 205 lb (92.987 kg)  04/02/11 198 lb 9.6 oz (90.084 kg)   Constitutional:  He appears well-developed and well-nourished. No distress. HENT: NCAT, sinus nontender - L septal deviation - TMS clear  Eyes: PERRL, EOMI - no icterus or conjunctivitis Neck: Normal range of motion. Neck supple. No JVD present. No thyromegaly present.  Cardiovascular: Normal rate, regular rhythm and normal heart sounds.  No murmur heard. no BLE edema Pulmonary/Chest: Effort  normal and breath sounds normal. No respiratory distress. no wheezes.  Abdominal: lap band intact, nontender -Soft. Bowel sounds are normal. Patient exhibits no distension. There is no tenderness.  Musculoskeletal: Normal range of motion. Patient exhibits no edema.  Neurological: he is alert and oriented to person, place, and time. No cranial nerve deficit. Coordination normal.  Skin: Skin is warm and dry.  No erythema or ulceration.  Psychiatric: he has a normal mood and affect. behavior is normal. Judgment and thought content normal.   Lab Results  Component Value Date   WBC 5.3 08/27/2011   HGB 16.0 08/27/2011   HCT 47.2 08/27/2011   PLT 218.0 08/27/2011   GLUCOSE 92 08/27/2011   CHOL 156 08/27/2011   TRIG 91.0 08/27/2011   HDL 45.70 08/27/2011   LDLCALC 92 08/27/2011   ALT 28 08/27/2011   AST 24 08/27/2011   NA 141 08/27/2011   K 4.1 08/27/2011   CL 102 08/27/2011   CREATININE 0.8 08/27/2011   BUN 16 08/27/2011   CO2 32 08/27/2011   TSH 1.95 08/27/2011   PSA 0.83 08/27/2011   HGBA1C 5.1 12/28/2008   EKG: NSR @ 71 bpm - no arrythmia or ischemic changes    Assessment & Plan:   CPX - v70.0 - Patient has been counseled on age-appropriate routine health concerns for screening and prevention. These are reviewed and up-to-date.  Immunizations are up-to-date or declined. Labs and ECG reviewed.

## 2011-09-07 NOTE — Patient Instructions (Signed)
It was good to see you today. Exam, labs and EKG look great today. Health Maintenance reviewed - all recommended immunizations and screening for your age are up-to-date. Medications reviewed, no changes at this time. Refill on medication(s) as discussed today.  Please schedule followup in 6 months to monitor blood pressure and medication review, call sooner if problems.

## 2011-09-09 LAB — GC PROBE AMPLIFICATION, URINE: GC Probe Amp, Urine: NEGATIVE

## 2011-10-11 ENCOUNTER — Emergency Department
Admission: EM | Admit: 2011-10-11 | Discharge: 2011-10-11 | Disposition: A | Discharge status: Home / Self Care | Payer: 59 | Source: Home / Self Care | Attending: Family Medicine | Admitting: Family Medicine

## 2011-10-11 ENCOUNTER — Encounter: Payer: Self-pay | Admitting: Emergency Medicine

## 2011-10-11 DIAGNOSIS — J069 Acute upper respiratory infection, unspecified: Secondary | ICD-10-CM

## 2011-10-11 DIAGNOSIS — J0100 Acute maxillary sinusitis, unspecified: Secondary | ICD-10-CM

## 2011-10-11 DIAGNOSIS — H6691 Otitis media, unspecified, right ear: Secondary | ICD-10-CM

## 2011-10-11 DIAGNOSIS — H6690 Otitis media, unspecified, unspecified ear: Secondary | ICD-10-CM

## 2011-10-11 MED ORDER — BENZONATATE 200 MG PO CAPS: 200.0000 mg | ORAL_CAPSULE | Freq: Every day | ORAL | Status: AC

## 2011-10-11 MED ORDER — AMOXICILLIN-POT CLAVULANATE 875-125 MG PO TABS: 1.0000 | ORAL_TABLET | Freq: Two times a day (BID) | ORAL | Status: DC

## 2011-10-11 NOTE — Discharge Instructions (Signed)
Take Mucinex D (guaifenesin with decongestant) twice daily for congestion.  Increase fluid intake, rest. May use Afrin nasal spray (or generic oxymetazoline) twice daily for about 5 days.  Also recommend using saline nasal spray several times daily and saline nasal irrigation (AYR is a common brand) Stop all antihistamines for now, and other non-prescription cough/cold preparations.     

## 2011-10-11 NOTE — ED Provider Notes (Signed)
History     CSN: [id number]  Arrival date & time 10/11/11  1105   First MD Initiated Contact with Patient 10/11/11 1115      Chief Complaint  Patient presents with  . Nasal Congestion  . Otalgia     HPI Comments: Patient has a history of seasonal rhinitis, usually controlled with Claritin and Flonase nasal spray.  Three days ago he suddenly developed increased sinus congestion and pressure, followed by a mild sore throat.  He next developed bilateral mild earache/pressure, and decreased hearing in the right ear.  He has had sweats today.  No cough.  He has had multiple sinus infections in the past.  Patient is a 41 y.o. male presenting with ear pain. The history is provided by the patient.  Otalgia This is a new problem. The current episode started 2 days ago. There is pain in both ears. The problem occurs constantly. The problem has been gradually worsening. Maximum temperature: low grade. The pain is mild. Associated symptoms include headaches, hearing loss, rhinorrhea and sore throat. Pertinent negatives include no ear discharge, no abdominal pain, no diarrhea, no vomiting, no neck pain, no cough and no rash.    Past Medical History  Diagnosis Date  . Morbid obesity     s/p lap band  . Anal fissure 12/31/2008  . GERD   . HYPERLIPIDEMIA   . HYPERTENSION   . NEPHROLITHIASIS     Past Surgical History  Procedure Date  . Tonsillectomy 1991  . Closed reduction wrist fracture     @ age 36  . Laparoscopic gastric banding 03/18/2010  . Cholecystectomy 10/2010    Family History  Problem Relation Age of Onset  . Coronary artery disease Mother   . Heart attack Mother   . Hypertension Father   . Heart attack Father   . Prostate cancer Father   . Atrial fibrillation Father   . Hypertension Sister     History  Substance Use Topics  . Smoking status: Never Smoker   . Smokeless tobacco: Not on file  . Alcohol Use: No      Review of Systems  HENT: Positive for hearing  loss, ear pain, sore throat and rhinorrhea. Negative for neck pain and ear discharge.   Respiratory: Negative for cough.   Gastrointestinal: Negative for vomiting, abdominal pain and diarrhea.  Skin: Negative for rash.  Neurological: Positive for headaches.   + sore throat, mild No cough No pleuritic pain No wheezing + nasal congestion + post-nasal drainage + sinus pain/pressure No itchy/red eyes + bilateral earache, decreased hearing on right No hemoptysis No SOB No fever, + chills/sweats No nausea No vomiting No abdominal pain No diarrhea No urinary symptoms No skin rashes + fatigue + myalgias + headache Used OTC meds without relief (loratadine) Allergies  Dilaudid; Other; Peanut-containing drug products; and Shellfish-derived products  Home Medications   Current Outpatient Rx  Name Route Sig Dispense Refill  . AMOXICILLIN-POT CLAVULANATE 875-125 MG PO TABS Oral Take 1 tablet by mouth 2 (two) times daily. Take with food 20 tablet 0  . BENZONATATE 200 MG PO CAPS Oral Take 1 capsule (200 mg total) by mouth at bedtime. Take as needed for cough 12 capsule 0  . CALCIUM CITRATE 950 MG PO TABS Oral Take 1 tablet by mouth daily.      Marland Kitchen FISH OIL-CANOLA OIL-VIT D3 PO LIQD Oral Take 5 mLs by mouth daily.      Marland Kitchen FLUTICASONE PROPIONATE 50 MCG/ACT NA SUSP Nasal  Place 1 spray into the nose daily as needed. Seasonal allergies 16 g 2  . LORATADINE 10 MG PO TABS Oral Take 10 mg by mouth daily as needed. For seasonal allergies    . ONE-DAILY MULTI VITAMINS PO TABS Oral Take 1 tablet by mouth daily.      Marland Kitchen VALACYCLOVIR HCL 1 G PO TABS Oral Take 1 tablet (1,000 mg total) by mouth as needed. 3 tablet 1    BP 135/87  Pulse 77  Temp(Src) 98.3 F (36.8 C) (Oral)  Resp 16  Ht 5\' 6"  (1.676 m)  Wt 204 lb (92.534 kg)  BMI 32.93 kg/m2  SpO2 98%  Physical Exam Nursing notes and Vital Signs reviewed. Appearance:  Patient appears healthy, stated age, and in no acute distress.  He is Alert  and oriented  Eyes:  Pupils are equal, round, and reactive to light and accomodation.  Extraocular movement is intact.  Conjunctivae are not inflamed  Ears:  Canals normal.  Left tympanic membrane normal.  Right tympanic membrane appears to have some serous effusion present and landmarks are not quite normal Nose:  Congested turbinates, worse on right.  Maxillary sinus tenderness is present, worse on right. Pharynx:  Normal Neck:  Supple.   Tender shotty anterior/posterior nodes are palpated bilaterally  Lungs:  Clear to auscultation.  Breath sounds are equal.  Heart:  Regular rate and rhythm without murmurs, rubs, or gallops.  Abdomen:  Nontender without masses or hepatosplenomegaly.  Bowel sounds are present.  No CVA or flank tenderness.  Extremities:  No edema.  No calf tenderness Skin:  No rash present.   ED Course  Procedures  none  Labs Reviewed - Tympanogram normal left ear; low peak height right ear     1. Acute upper respiratory infections of unspecified site   2. Right otitis media   3. Acute maxillary sinusitis   Patient's chart reviewed.  He has had multiple episodes of sinusitis and otitis media in the past.  His present viral URI is complicated by the pre-existing congestion of seasonal rhinitis.    MDM  Begin Augmentin for 10 days.  Prescription written for Benzonatate [facility]) to take at bedtime for night-time cough when it develops Take Mucinex D (guaifenesin with decongestant) twice daily for congestion.  Increase fluid intake, rest. May use Afrin nasal spray (or generic oxymetazoline) twice daily for about 5 days.  Also recommend using saline nasal spray several times daily and saline nasal irrigation (AYR is a common brand) Stop all antihistamines for now, and other non-prescription cough/cold preparations.  Followup with ENT if ears not improving one week.        Lattie Haw, MD 10/11/11 1153

## 2011-10-11 NOTE — ED Notes (Signed)
Sinus congestion with thick yellow/green discharge x 3 days; awoke this a.m. with bilateral ear discomfort. Took Tylenol at 0500 today.

## 2011-10-24 IMAGING — CR DG WRIST COMPLETE 3+V*L*
4 series · 4 of 4 positions shown · non-contrast
Comparison: None.

CLINICAL DATA: The patient fell while skating, injuring the left
wrist.  Pain on the ulnar side.

LEFT WRIST - COMPLETE 3+ VIEW

[x wrist pa left]
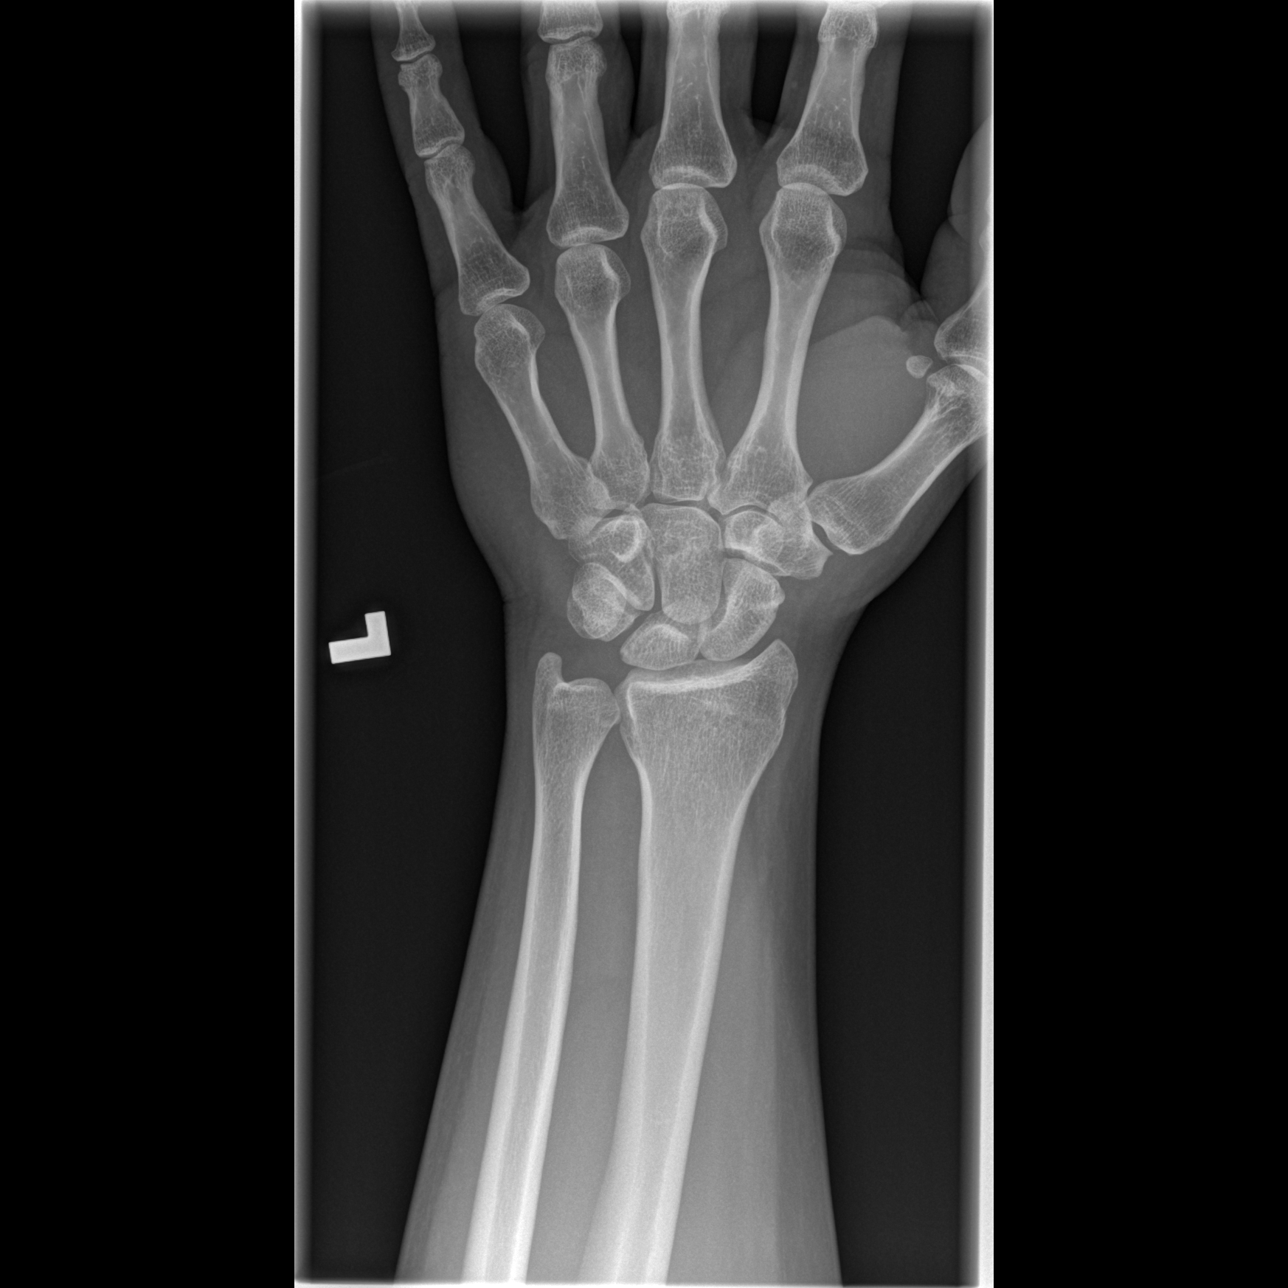

[x wrist obl left]
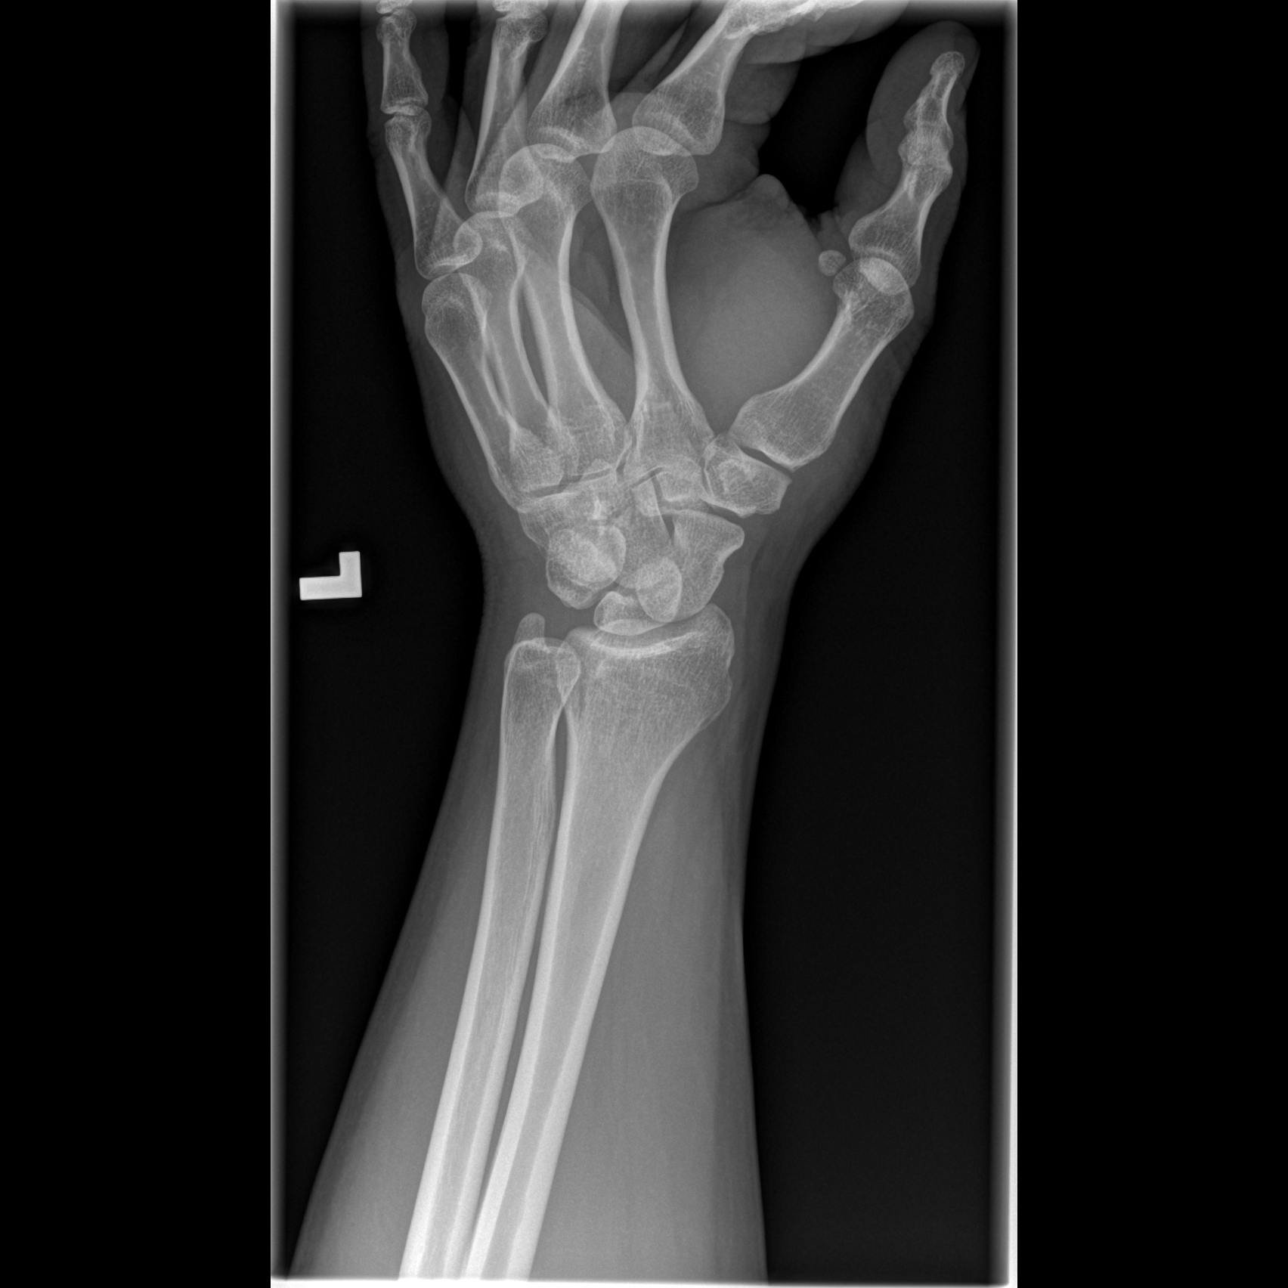

[x wrist lat left]
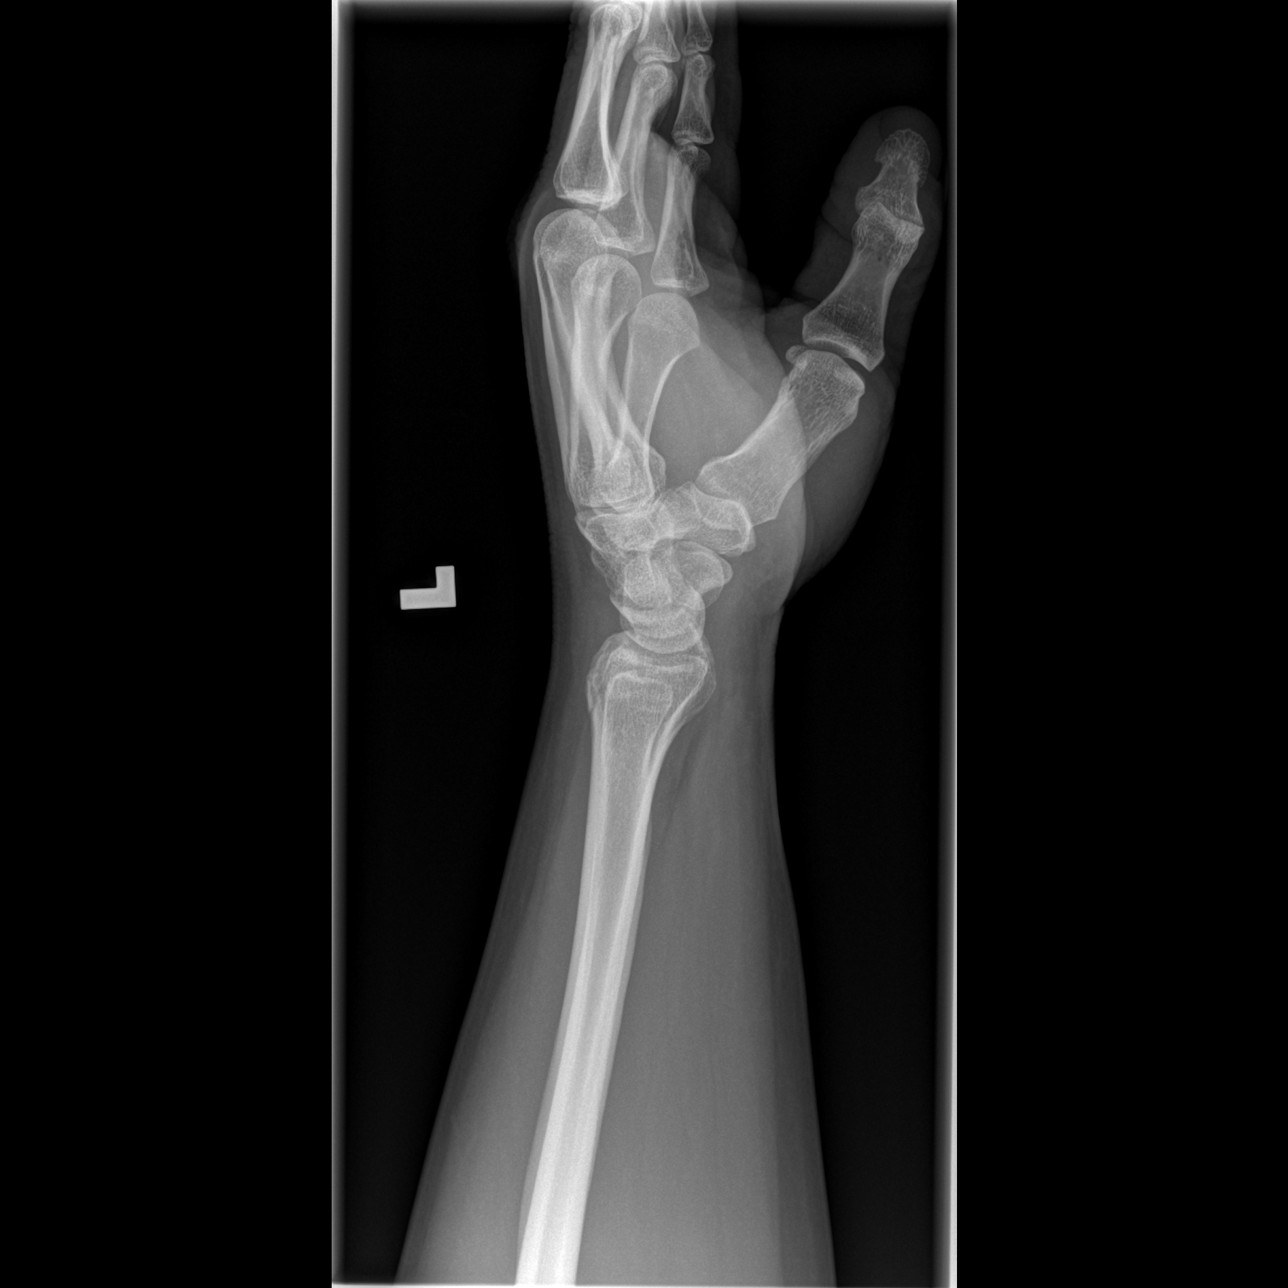

[x navicular]
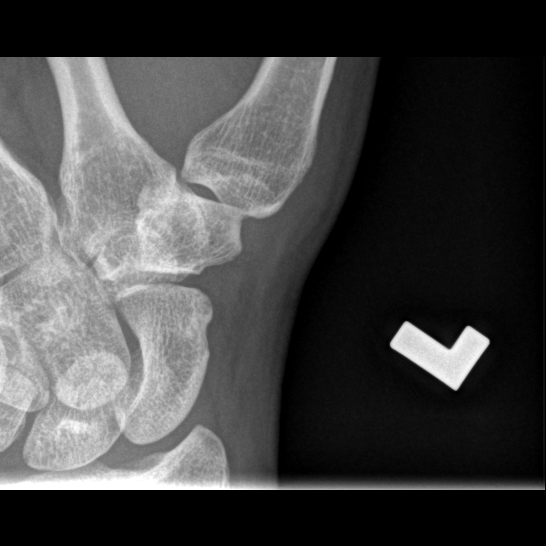

[4 of 4 positions shown; findings below may reference images not displayed]

FINDINGS: Mild degenerative narrowing of the STT and first
carpometacarpal joints.  There is a suggestion of a small cortical
cleft along the dorsal aspect of the distal left radius seen only
on the lateral view.  No corresponding abnormality on the other
views and no soft tissue swelling demonstrated.  This is likely to
represent a small vascular channel.  Nondisplaced fracture is not
entirely excluded.  The left wrist bones appear otherwise intact.
No evidence of subluxation.
IMPRESSION: Mild degenerative changes in the left wrist.  Small cortical
irregularity on the dorsal surface of the distal radius without
associated displacement or soft tissue swelling, most likely
nonacute.  No definite fracture demonstrated.

## 2011-11-08 ENCOUNTER — Emergency Department (HOSPITAL_COMMUNITY): Payer: 59

## 2011-11-08 ENCOUNTER — Encounter (HOSPITAL_COMMUNITY): Payer: Self-pay | Admitting: Emergency Medicine

## 2011-11-08 ENCOUNTER — Observation Stay (HOSPITAL_COMMUNITY)
Admission: EM | Admit: 2011-11-08 | Discharge: 2011-11-09 | Disposition: A | Discharge status: Home / Self Care | Payer: 59 | Attending: Emergency Medicine | Admitting: Emergency Medicine

## 2011-11-08 DIAGNOSIS — R079 Chest pain, unspecified: Principal | ICD-10-CM | POA: Insufficient documentation

## 2011-11-08 DIAGNOSIS — I10 Essential (primary) hypertension: Secondary | ICD-10-CM | POA: Insufficient documentation

## 2011-11-08 DIAGNOSIS — M79609 Pain in unspecified limb: Secondary | ICD-10-CM | POA: Insufficient documentation

## 2011-11-08 DIAGNOSIS — K219 Gastro-esophageal reflux disease without esophagitis: Secondary | ICD-10-CM | POA: Insufficient documentation

## 2011-11-08 DIAGNOSIS — E785 Hyperlipidemia, unspecified: Secondary | ICD-10-CM | POA: Insufficient documentation

## 2011-11-08 DIAGNOSIS — R61 Generalized hyperhidrosis: Secondary | ICD-10-CM | POA: Insufficient documentation

## 2011-11-08 DIAGNOSIS — R110 Nausea: Secondary | ICD-10-CM | POA: Insufficient documentation

## 2011-11-08 DIAGNOSIS — Z8249 Family history of ischemic heart disease and other diseases of the circulatory system: Secondary | ICD-10-CM | POA: Insufficient documentation

## 2011-11-08 LAB — CARDIAC PANEL(CRET KIN+CKTOT+MB+TROPI)
CK, MB: 3 ng/mL (ref 0.3–4.0)
Relative Index: 1.2 (ref 0.0–2.5)
Total CK: 258 U/L — ABNORMAL HIGH (ref 7–232)
Troponin I: <0.3 ng/mL

## 2011-11-08 LAB — CBC
HCT: 43.2 % (ref 39.0–52.0)
Hemoglobin: 15.1 g/dL (ref 13.0–17.0)
MCH: 30.1 pg (ref 26.0–34.0)
MCHC: 35 g/dL (ref 30.0–36.0)
MCV: 86.2 fL (ref 78.0–100.0)
Platelets: 205 K/uL (ref 150–400)
RBC: 5.01 MIL/uL (ref 4.22–5.81)
RDW: 13.1 % (ref 11.5–15.5)
WBC: 5.9 K/uL (ref 4.0–10.5)

## 2011-11-08 LAB — BASIC METABOLIC PANEL WITH GFR
BUN: 20 mg/dL (ref 6–23)
CO2: 26 meq/L (ref 19–32)
Calcium: 9.3 mg/dL (ref 8.4–10.5)
Chloride: 103 meq/L (ref 96–112)
Creatinine, Ser: 0.67 mg/dL (ref 0.50–1.35)
GFR calc Af Amer: >90 mL/min
GFR calc non Af Amer: >90 mL/min
Glucose, Bld: 89 mg/dL (ref 70–99)
Potassium: 4.1 meq/L (ref 3.5–5.1)
Sodium: 138 meq/L (ref 135–145)

## 2011-11-08 LAB — POCT I-STAT TROPONIN I: Troponin i, poc: 0 ng/mL (ref 0.00–0.08)

## 2011-11-08 MED ORDER — MORPHINE SULFATE 4 MG/ML IJ SOLN
4.0000 mg | Freq: Once | INTRAMUSCULAR | Status: AC
  Administered 2011-11-08: 4 mg via INTRAVENOUS
  Filled 2011-11-08: qty 1

## 2011-11-08 MED ORDER — METHYLPREDNISOLONE SODIUM SUCC 125 MG IJ SOLR
50.0000 mg | Freq: Once | INTRAMUSCULAR | Status: AC
  Administered 2011-11-09: 50 mg via INTRAVENOUS

## 2011-11-08 MED ORDER — ONDANSETRON HCL 4 MG/2ML IJ SOLN
4.0000 mg | Freq: Once | INTRAMUSCULAR | Status: AC
  Administered 2011-11-08: 4 mg via INTRAVENOUS
  Filled 2011-11-08: qty 2

## 2011-11-08 MED ORDER — SODIUM CHLORIDE 0.9 % IV SOLN
Freq: Once | INTRAVENOUS | Status: AC
  Administered 2011-11-08: 18:00:00 via INTRAVENOUS

## 2011-11-08 MED ORDER — NITROGLYCERIN 0.4 MG SL SUBL
0.4000 mg | SUBLINGUAL_TABLET | SUBLINGUAL | Status: AC | PRN
  Administered 2011-11-08 – 2011-11-09 (×3): 0.4 mg via SUBLINGUAL
  Filled 2011-11-08 (×2): qty 25

## 2011-11-08 MED ORDER — METHYLPREDNISOLONE SODIUM SUCC 125 MG IJ SOLR: 125.0000 mg | Freq: Once | INTRAMUSCULAR | Status: DC

## 2011-11-08 MED ORDER — ASPIRIN 81 MG PO CHEW
324.0000 mg | CHEWABLE_TABLET | Freq: Once | ORAL | Status: AC
  Administered 2011-11-08: 324 mg via ORAL
  Filled 2011-11-08: qty 4

## 2011-11-08 MED ORDER — DIPHENHYDRAMINE HCL 50 MG/ML IJ SOLN
50.0000 mg | Freq: Once | INTRAMUSCULAR | Status: AC
  Administered 2011-11-09: 50 mg via INTRAVENOUS
  Filled 2011-11-08: qty 1

## 2011-11-08 MED ORDER — METHYLPREDNISOLONE SODIUM SUCC 125 MG IJ SOLR
50.0000 mg | Freq: Once | INTRAMUSCULAR | Status: AC
  Administered 2011-11-08: 50 mg via INTRAVENOUS

## 2011-11-08 MED ORDER — GI COCKTAIL ~~LOC~~
30.0000 mL | Freq: Once | ORAL | Status: AC
  Administered 2011-11-08: 30 mL via ORAL
  Filled 2011-11-08: qty 30

## 2011-11-08 MED ORDER — METHYLPREDNISOLONE SODIUM SUCC 125 MG IJ SOLR
50.0000 mg | Freq: Once | INTRAMUSCULAR | Status: AC
  Administered 2011-11-09: 50 mg via INTRAVENOUS
  Filled 2011-11-08 (×3): qty 2

## 2011-11-08 MED ORDER — DIPHENHYDRAMINE HCL 50 MG/ML IJ SOLN: 25.0000 mg | Freq: Once | INTRAMUSCULAR | Status: DC

## 2011-11-08 MED ORDER — MORPHINE SULFATE 4 MG/ML IJ SOLN
8.0000 mg | Freq: Once | INTRAMUSCULAR | Status: AC
  Administered 2011-11-08: 8 mg via INTRAVENOUS
  Filled 2011-11-08: qty 2

## 2011-11-08 NOTE — ED Provider Notes (Signed)
Medical screening examination/treatment/procedure(s) were conducted as a shared visit with non-physician practitioner(s) and myself.  I personally evaluated the patient during the encounter   Dione Booze, MD 11/08/11 1526

## 2011-11-08 NOTE — ED Provider Notes (Signed)
41 year old male with a strong family history of coronary disease and a prior history of hypertension which resolved after LAP-BAND surgery and weight loss comes in with onset about 30 minutes ago of severe chest tightness and pressure with some radiation to the left shoulder. This associated with dyspnea, nausea, diaphoresis. Thus far, he has had no relief with nitroglycerin. On exam, he does have mild diastolic hypertension with blood pressure 136/98 and he does appear somewhat anxious and he states that he is under increased stress recently. Lungs are clear and heart has regular rate and rhythm. He has no cyanosis or edema. Cardiac markers will be checked and will try to get him pain-free. I'm hoping to get a coronary CT scan done today to try and rule out coronary artery disease.  ECG shows normal sinus rhythm with a rate of 75, no ectopy. Normal axis. Normal P wave. Normal QRS. Normal intervals. Normal ST and T waves. Impression: normal ECG. When compared with ECG of 11/02/2010, no significant changes are seen.   Dione Booze, MD 11/10/11 [phone]

## 2011-11-08 NOTE — ED Provider Notes (Signed)
History     CSN: [id number]  Arrival date & time 11/08/11  4098   First MD Initiated Contact with Patient 11/08/11 1001      Chief Complaint  Patient presents with  . Chest Pain  . Nausea  . Arm Pain    (Consider location/radiation/quality/duration/timing/severity/associated sxs/prior treatment) HPI  Patient to the ED with chest pain that started 30 minutes ago and associated with nausea, diaphoresis and a chest pressure radiating to his left arm. He is no longer on any medications after receiving lap band surgery and loosing a lot of weight. 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 The patient has significant family history, his mother and brother have died from cardiac dz and another brother has had valves replaced. His pain is currently a 6/10 and has not been relieved with one nitro so far. Patient is in NAD and  VS at this time.  Past Medical History  Diagnosis Date  . Morbid obesity     s/p lap band  . Anal fissure 12/31/2008  . GERD   . HYPERLIPIDEMIA   . HYPERTENSION   . NEPHROLITHIASIS     Past Surgical History  Procedure Date  . Tonsillectomy 1991  . Closed reduction wrist fracture     @ age 76  . Laparoscopic gastric banding 03/18/2010  . Cholecystectomy 10/2010    Family History  Problem Relation Age of Onset  . Coronary artery disease Mother   . Heart attack Mother   . Hypertension Father   . Heart attack Father   . Prostate cancer Father   . Atrial fibrillation Father   . Hypertension Sister     History  Substance Use Topics  . Smoking status: Never Smoker   . Smokeless tobacco: Not on file  . Alcohol Use: No      Review of Systems   HEENT: denies blurry vision or change in hearing PULMONARY: Denies difficulty breathing and SOB CARDIAC:  heart palpitations MUSCULOSKELETAL:  denies being unable to ambulate ABDOMEN AL: denies abdominal pain GU: denies loss of bowel or urinary control NEURO: denies  numbness and tingling in extremities SKIN: no new rashes PSYCH: patient behavior is normal NECK: Not complaining of neck pain     Allergies  Dilaudid; Other; Peanut-containing drug products; and Shellfish-derived products  Home Medications   Current Outpatient Rx  Name Route Sig Dispense Refill  . FLUTICASONE PROPIONATE 50 MCG/ACT NA SUSP Nasal Place 1 spray into the nose daily as needed. Seasonal allergies 16 g 2  . LORATADINE 10 MG PO TABS Oral Take 10 mg by mouth daily.    Marland Kitchen ONE-DAILY MULTI VITAMINS PO TABS Oral Take 1 tablet by mouth daily.      Marland Kitchen VALACYCLOVIR HCL 1 G PO TABS Oral Take 1 tablet (1,000 mg total) by mouth as needed. 3 tablet 1    BP 125/84  Pulse 77  Temp(Src) 98.2 F (36.8 C) (Oral)  Resp 18  Ht 5\' 6"  (1.676 m)  Wt 204 lb (92.534 kg)  BMI 32.93 kg/m2  SpO2 99%  Physical Exam  Nursing note and vitals reviewed. Constitutional: He appears well-developed and well-nourished. No distress.  HENT:  Head: Normocephalic and atraumatic.  Eyes: Pupils are equal, round, and reactive to light.  Neck: Normal range of motion. Neck supple.  Cardiovascular: Normal rate and regular rhythm.   Pulmonary/Chest: Effort normal.  Abdominal: Soft.  Neurological: He is alert.  Skin: Skin is warm  and dry.    ED Course  Procedures (including critical care time)  Labs Reviewed  CARDIAC PANEL(CRET KIN+CKTOT+MB+TROPI) - Abnormal; Notable for the following:    Total CK 258 (*)    All other components within normal limits  CBC  BASIC METABOLIC PANEL   Dg Chest 2 View  11/08/2011  *RADIOLOGY REPORT*  Clinical Data: Chest pain  CHEST - 2 VIEW  Comparison: 03/24/2009  Findings: Cardiomediastinal silhouette is stable.  No acute infiltrate or pleural effusion.  No pulmonary edema.  Bony thorax is stable.  IMPRESSION: No active disease.  No significant change.  Original Report Authenticated By: Natasha Mead, M.D.     1. Chest pain       MDM    12;00 PM- Dr. Preston Fleeting has   evaluated patient as well. Patient is pain free currently but still feeling a pressure to his chest and left arm after 2 SL nitro. I have ordered 8 mg Morphine. Patient has shell fish allergy and is scheduled for coronary CT in the morning. He will need to be on Solumedrol and Benadryl treatments starting this evening to prep for IV contrast needed for testing.     After talking to Dr. Carlynn Purl, Patient is to take 50mg  solumedrol 13 hr, 7hr, and 1 hr before CCTA. Also, Benadryl 50mg  1 hr before coronary CT.  Patient moved to CDU on CPP.   Dorthula Matas, PA 11/08/11 1523

## 2011-11-08 NOTE — ED Notes (Addendum)
Pt was walking from cafeteria and had sudden left sided chest pain radiating into left shoulder and down arm. Pt is employee here at [employer]. Pt presents diaphoretic and flushed. Pt reports his mom died at age 43 from CAD.

## 2011-11-09 ENCOUNTER — Observation Stay (HOSPITAL_COMMUNITY): Payer: 59

## 2011-11-09 ENCOUNTER — Other Ambulatory Visit: Payer: Self-pay | Admitting: Internal Medicine

## 2011-11-09 MED ORDER — METOPROLOL TARTRATE 1 MG/ML IV SOLN
INTRAVENOUS | Status: AC
  Administered 2011-11-09: 5 mg
  Filled 2011-11-09: qty 10

## 2011-11-09 MED ORDER — ALPRAZOLAM 0.25 MG PO TABS
0.5000 mg | ORAL_TABLET | Freq: Once | ORAL | Status: AC
  Administered 2011-11-09: 0.5 mg via ORAL
  Filled 2011-11-09: qty 1

## 2011-11-09 MED ORDER — METOPROLOL TARTRATE 25 MG PO TABS
50.0000 mg | ORAL_TABLET | Freq: Once | ORAL | Status: AC
  Administered 2011-11-09: 50 mg via ORAL
  Filled 2011-11-09: qty 2

## 2011-11-09 MED ORDER — IOHEXOL 350 MG/ML SOLN
80.0000 mL | Freq: Once | INTRAVENOUS | Status: AC | PRN
  Administered 2011-11-09: 80 mL via INTRAVENOUS

## 2011-11-09 MED ORDER — MORPHINE SULFATE 4 MG/ML IJ SOLN
4.0000 mg | Freq: Once | INTRAMUSCULAR | Status: AC
  Administered 2011-11-09: 4 mg via INTRAVENOUS
  Filled 2011-11-09: qty 1

## 2011-11-09 MED ORDER — ALPRAZOLAM 0.5 MG PO TABS: 0.5000 mg | ORAL_TABLET | Freq: Every evening | ORAL | Status: DC | PRN

## 2011-11-09 NOTE — ED Notes (Signed)
Pt. Returned from the Ct scan, tolerated without difficulty, no s/s of allergic reaction.  Will continue to monitor

## 2011-11-09 NOTE — Discharge Instructions (Signed)
Read the information below.  Please call Dr Felicity Coyer and [facility] cardiology today to schedule close follow up appointments.  As we discussed, if you develop worsening chest pain, especially with exertion, or any worsening shortness of breath, nausea, diaphoresis, or lightheadedness, return to the ER immediately for a recheck.  You may return to the ER at any time for worsening condition or any new symptoms that concern you.

## 2011-11-09 NOTE — ED Provider Notes (Signed)
Am ekg per protocol  Date: 11/09/2011  Rate: 59  Rhythm: normal sinus rhythm  QRS Axis: normal  Intervals: normal  ST/T Wave abnormalities: normal  Conduction Disutrbances: none  Narrative Interpretation: unremarkable    e  Hilario Quarry, MD 11/09/11 [phone]

## 2011-11-09 NOTE — ED Notes (Signed)
Pt. Reports chest pain has returned it is constant pressure and it radiates into his back. Pt. Skin is warm and dry, resp. Easy and unlabored.  Pt. Denies any nausea

## 2011-11-09 NOTE — ED Notes (Signed)
Patient denies pain and is resting comfortably.  

## 2011-11-09 NOTE — ED Provider Notes (Signed)
7:37 AM Patient is in CDU under observation, chest pain protocol.  This is a shared visit with Dr Preston Fleeting.  Per overnight nurse [place], pt did have some chest pain overnight, relieved with pain medication.  Pt denies any current chest pain, SOB, or nausea.  Pt had episode of CP, SOB, nausea and diaphoresis yesterday while walking, has family hx CAD.  Pt has allergy to shellfish and has had slight reaction to IV dye in the past - plan is for continued premedication with solu-medrol and benadryl prior to cardiac CT this morning.  Pt has no needs at this time.  On exam, pt is A&Ox4, NAD, RRR, no m/r/g, CTAB, abd soft, NT, extremities without edema, distal pulses intact and equal bilaterally.  Will continue to follow.    8:57 AM Patient is having 4/10 left chest pressure with radiation to the back.  [employer]er) to call radiologist to have pain medication approved prior to study.    11:10 AM Received call from Dr Amil Amen.  Pt has a CAD score of zero.  However, pt does have a noncalcified plaque in his LAD.    11:35 AM Discussed results with patient and his partner.  Patient reports chest pain is now 1/10, tolerable, declines pain medication.  States he can walk around without any exacerbation of pain.  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  He feels strongly that this is stress and anxiety related.  PCP is Multimedia programmer).  Pt will be d/c home with PCP and cardiology follow up.  Discussed return precautions.  Patient verbalizes understanding and agrees with plan.    11:43 AM Given patient's high level of stress - working full time and going to school full time, just having built and moved into a house - patient encouraged to follow up with PCP about this.  Will give one dose of xanax as trial for patient to see if it helps his symptoms.  Patient agrees with plan.    Results for orders placed during the hospital encounter of 11/08/11  CBC      Component  Value Range   WBC 5.9  4.0 - 10.5 (K/uL)   RBC 5.01  4.22 - 5.81 (MIL/uL)   Hemoglobin 15.1  13.0 - 17.0 (g/dL)   HCT 16.1  09.6 - 04.5 (%)   MCV 86.2  78.0 - 100.0 (fL)   MCH 30.1  26.0 - 34.0 (pg)   MCHC 35.0  30.0 - 36.0 (g/dL)   RDW 40.9  81.1 - 91.4 (%)   Platelets 205  150 - 400 (K/uL)  BASIC METABOLIC PANEL      Component Value Range   Sodium 138  135 - 145 (mEq/L)   Potassium 4.1  3.5 - 5.1 (mEq/L)   Chloride 103  96 - 112 (mEq/L)   CO2 26  19 - 32 (mEq/L)   Glucose, Bld 89  70 - 99 (mg/dL)   BUN 20  6 - 23 (mg/dL)   Creatinine, Ser 7.82  0.50 - 1.35 (mg/dL)   Calcium 9.3  8.4 - 95.6 (mg/dL)   GFR calc non Af Amer >90  >90 (mL/min)   GFR calc Af Amer >90  >90 (mL/min)  CARDIAC PANEL(CRET KIN+CKTOT+MB+TROPI)      Component Value Range   Total CK 258 (*) 7 - 232 (U/L)   CK, MB 3.0  0.3 - 4.0 (ng/mL)   Troponin I <  0.30  <0.30 (ng/mL)   Relative Index 1.2  0.0 - 2.5   POCT I-STAT TROPONIN I      Component Value Range   Troponin i, poc 0.00  0.00 - 0.08 (ng/mL)   Comment 3           POCT I-STAT TROPONIN I      Component Value Range   Troponin i, poc 0.00  0.00 - 0.08 (ng/mL)   Comment 3            Dg Chest 2 View  11/08/2011  *RADIOLOGY REPORT*  Clinical Data: Chest pain  CHEST - 2 VIEW  Comparison: 03/24/2009  Findings: Cardiomediastinal silhouette is stable.  No acute infiltrate or pleural effusion.  No pulmonary edema.  Bony thorax is stable.  IMPRESSION: No active disease.  No significant change.  Original Report Authenticated By: Natasha Mead, M.D.   Ct Heart Morp W/cta Cor W/score W/ca W/cm &/or Wo/cm  11/09/2011  *RADIOLOGY REPORT*  INDICATION:  41 year old male with chest pain, nausea and diaphoresis.  Chest pain radiating to left arm.  Significant family history of coronary artery disease.  CT ANGIOGRAPHY OF THE HEART, CORONARY ARTERY, STRUCTURE, AND MORPHOLOGY  CONTRAST: 80mL OMNIPAQUE IOHEXOL 350 MG/ML SOLN  COMPARISON:  None  TECHNIQUE:  CT angiography of the  coronary vessels was performed on a 256 channel system using prospective ECG gating.  A scout and noncontrast exam (for calcium scoring) were performed.  Circulation time was measured using a test bolus.  Coronary CTA was performed with sub mm slice collimation during portions of the cardiac cycle after prior injection of iodinated contrast.  Imaging post processing was performed on an independent workstation creating multiplanar and 3-D images, and quantitative analysis of the heart and coronary arteries.  Note that this exam targets the heart and the chest was not imaged in its entirety.  PREMEDICATION: Lopressor 50 mg, P.O. Lopressor 5 mg, IV Nitroglycerin 400 mcg, sublingual.  FINDINGS: Technical quality:  Good  Heart rate:  63  CORONARY ARTERIES: Left main coronary artery:  No coronary artery disease Left anterior descending:  Ecentric noncalcified plaque in the proximal LAD with mild negative remodeling.  Zero  to 25% stenosis. Ecentric noncalcified plaque in the mid LAD with zero to 25% stenosis.    Single diagonal branch without atherosclerotic disease disease.  Left circumflex:  No atherosclerotic disease.  Obtuse marginal branch with no evidence of atherosclerotic disease.  Right coronary artery:  No atherosclerotic disease. Posterior descending artery:  No atherosclerotic disease. Dominance:  Right  CORONARY CALCIUM: Total Agatston Score:  Zero MESA database percentile:  0  CARDIAC MEASUREMENTS: Interventricular septum (6 - 12 mm):  No  AORTA AND PULMONARY MEASUREMENTS: Aortic root (21 - 40 mm):             29 mm  at the annulus             36 mm  at the sinuses of Valsalva             28 mm  at the sinotubular junction Ascending aorta ( <  40 mm):  31 mm Descending aorta ( <  40 mm):   23 mm Main pulmonary artery:  ( <  30 mm): 24 mm  EXTRACARDIAC FINDINGS: Low density lesion within the liver measuring 13 mm likely represents a simple cyst.  Gastric banding device noted.  IMPRESSION:  1. Noncalcified  plaque within the left anterior descending aorta without significant stenosis  2  Calcium score equal zero which is 0% percentile for patient's matched age and gender.  3.  Right coronary artery dominance.  Report was called to CDU mid level at [phone]  at 11:10 on 11/09/2011.  Original Report Authenticated By: Genevive Bi, M.D.      Rise Patience, [place] 11/09/11 1301

## 2011-11-09 NOTE — ED Notes (Signed)
Pt. Is resting comfortablly, continues to have mild pressure, described as  "A hand on my chest, but nothing like yesterday very mild.  "    Reported to [place], [place] ,  Pt. Given pre meds prior to his CT.

## 2011-11-10 ENCOUNTER — Telehealth: Payer: Self-pay | Admitting: Internal Medicine

## 2011-11-10 NOTE — Telephone Encounter (Signed)
Caller: Brad Barrera; PCP: Rene Paci; CB#: [phone]; Call regarding Patient Was Discharged From [facility] 11/09/11; Dx w  Small LAD Obstruction;To See His PCP and Cardiology  ASAP.  Denies any current sx.   Advised caller that there are appointments with PCP on 6/12; the next available date is 6/17.  He states he cannot miss school on 11/11/11 and has appointment on 6/17.  He opted to keep that appointment.  PCP Calls protocol used.

## 2011-11-10 NOTE — ED Provider Notes (Signed)
Medical screening examination/treatment/procedure(s) were conducted as a shared visit with non-physician practitioner(s) and myself.  I personally evaluated the patient during the encounter   Dione Booze, MD 11/10/11 [phone]

## 2011-11-10 NOTE — Telephone Encounter (Signed)
noted 

## 2011-11-11 ENCOUNTER — Encounter: Payer: Self-pay | Admitting: Internal Medicine

## 2011-11-11 ENCOUNTER — Ambulatory Visit (INDEPENDENT_AMBULATORY_CARE_PROVIDER_SITE_OTHER): Payer: 59 | Admitting: Internal Medicine

## 2011-11-11 ENCOUNTER — Telehealth: Payer: Self-pay | Admitting: Internal Medicine

## 2011-11-11 VITALS — BP 138/92 | HR 75 | Temp 98.5°F | Ht 65.0 in | Wt 207.8 lb

## 2011-11-11 DIAGNOSIS — R079 Chest pain, unspecified: Secondary | ICD-10-CM

## 2011-11-11 DIAGNOSIS — F438 Other reactions to severe stress: Secondary | ICD-10-CM

## 2011-11-11 DIAGNOSIS — F4389 Other reactions to severe stress: Secondary | ICD-10-CM

## 2011-11-11 DIAGNOSIS — F430 Acute stress reaction: Secondary | ICD-10-CM

## 2011-11-11 MED ORDER — ALPRAZOLAM 0.5 MG PO TABS: 0.5000 mg | ORAL_TABLET | Freq: Every evening | ORAL | Status: DC | PRN

## 2011-11-11 MED ORDER — ASPIRIN EC 81 MG PO TBEC: 81.0000 mg | DELAYED_RELEASE_TABLET | Freq: Every day | ORAL | Status: AC

## 2011-11-11 MED ORDER — PAROXETINE HCL 10 MG PO TABS: 10.0000 mg | ORAL_TABLET | ORAL | Status: DC

## 2011-11-11 MED ORDER — OMEPRAZOLE 20 MG PO CPDR: 20.0000 mg | DELAYED_RELEASE_CAPSULE | Freq: Every day | ORAL | Status: DC

## 2011-11-11 NOTE — Telephone Encounter (Signed)
Caller: Brad Barrera/Patient; PCP: Rene Paci; CB#: [phone]; ; ; Call regarding Chest Pain/Chest Discomfort; patient reports "funny feeling" in his chest which is a pressure type feeling. Emergent s/s of "Pressure, fullness, squeesing sensation or pain anywhere in the chest lasting 5 or more minutes now or within the last hour. Pain is NOT associated with taking a deep breath or a productive cough, movement, or touch to a localized area on the chest." per Chest Pain guideline. Instructed patient to hang up and dial 911 at this time so he may be evaluated as quickly as possible for these symptoms. He agreed to do that.

## 2011-11-11 NOTE — Telephone Encounter (Signed)
If pt has not already gone to ER, please add on today -

## 2011-11-11 NOTE — Telephone Encounter (Signed)
PT IS COMING AT 10:15 TODAY.

## 2011-11-11 NOTE — Telephone Encounter (Signed)
Notified pt md will work in today. Transferred to schedulers to make appt... 11/11/11@9 :19am/LMB

## 2011-11-11 NOTE — Telephone Encounter (Signed)
Brad Barrera went to the ER for chest pain.  He has an appt on Monday but was requesting to come today.  He is still having chest pressure.  Could he come in today?  I also transferred him to the call a nurse so they could triage the chest pressure.

## 2011-11-11 NOTE — Patient Instructions (Signed)
It was good to see you today. We have reviewed your prior records including labs and tests today we'll make referral for stress test. Our office will contact you regarding appointment(s) once made. Start baby aspirin daily x 2 weeks, omeprazole daily x 2 weeks and paxil every day - Continue xanax as needed Your prescription(s) have been submitted to your pharmacy. Please take as directed and contact our office if you believe you are having problem(s) with the medication(s). Keep appointment with cards as scheduled Please schedule followup in 4 weeks, call sooner if problems.

## 2011-11-11 NOTE — Progress Notes (Signed)
  Subjective:    Patient ID: Brad Barrera, male    DOB: November 12, 1970, 41 y.o.   MRN: [id number]  HPI  Here for ER follow up - seen 11/08/11 for SSCP Several transient episodes - at rest and exertional -  Onset 1 week ago radiation to LUE and diaphoreisis ER and overnight CDU eval unremarkable for cardiac etiology and no PE or significant coronary blockage - felt related to stress/anxiety given numerous stressors  Pt continues with intermittent SSCP pressure despite xanax x 2 since DC CFR: +FH  Past Medical History  Diagnosis Date  . Morbid obesity     s/p lap band  . Anal fissure 12/31/2008  . GERD   . HYPERLIPIDEMIA   . HYPERTENSION   . NEPHROLITHIASIS    Family History  Problem Relation Age of Onset  . Coronary artery disease Mother   . Heart attack Mother   . Hypertension Father   . Heart attack Father   . Prostate cancer Father   . Atrial fibrillation Father   . Hypertension Sister      Review of Systems  Respiratory: Negative for cough and shortness of breath.   Cardiovascular: Positive for chest pain and palpitations.  Psychiatric/Behavioral: Positive for disturbed wake/sleep cycle, dysphoric mood and decreased concentration. The patient is nervous/anxious.        Objective:   Physical Exam BP 138/92  Pulse 75  Temp 98.5 F (36.9 C) (Oral)  Ht 5\' 5"  (1.651 m)  Wt 207 lb 12.8 oz (94.257 kg)  BMI 34.58 kg/m2  SpO2 95% Wt Readings from Last 3 Encounters:  11/11/11 207 lb 12.8 oz (94.257 kg)  11/08/11 204 lb (92.534 kg)  10/11/11 204 lb (92.534 kg)   Constitutional:  He appears well-developed and well-nourished. No distress.  Neck: Normal range of motion. Neck supple. No JVD present. No thyromegaly present.  Cardiovascular: Normal rate, regular rhythm and normal heart sounds.  No murmur heard. no BLE edema Pulmonary/Chest: Effort normal and breath sounds normal. No respiratory distress. no wheezes. Psychiatric: he has a dysphoric and anxious mood/affect.  behavior is normal. Judgment and thought content normal.   Lab Results  Component Value Date   WBC 5.9 11/08/2011   HGB 15.1 11/08/2011   HCT 43.2 11/08/2011   PLT 205 11/08/2011   GLUCOSE 89 11/08/2011   CHOL 156 08/27/2011   TRIG 91.0 08/27/2011   HDL 45.70 08/27/2011   LDLCALC 92 08/27/2011   ALT 28 08/27/2011   AST 24 08/27/2011   NA 138 11/08/2011   K 4.1 11/08/2011   CL 103 11/08/2011   CREATININE 0.67 11/08/2011   BUN 20 11/08/2011   CO2 26 11/08/2011   TSH 1.95 08/27/2011   PSA 0.83 08/27/2011   HGBA1C 5.1 12/28/2008   Lab Results  Component Value Date   CKTOTAL 258* 11/08/2011   CKMB 3.0 11/08/2011   TROPONINI <0.30 11/08/2011   ECG - reviewed in EMR     Assessment & Plan:  SSCP - refer for stress test to risk stratify Add ASA 81 + PPI qd x 2 weeks and arrange cards follow up (last OV 2010) Continue prn xanax and stress mgmt - see next  Stress and anxiety reaction, reviewed and support offered - start SSRI: low dose paxil - continue xanax prn - f/u 4 weeks

## 2011-11-16 ENCOUNTER — Ambulatory Visit: Payer: 59 | Admitting: Internal Medicine

## 2011-11-19 ENCOUNTER — Encounter: Payer: Self-pay | Admitting: Physician Assistant

## 2011-11-19 ENCOUNTER — Encounter (HOSPITAL_COMMUNITY): Payer: 59

## 2011-11-19 ENCOUNTER — Ambulatory Visit (INDEPENDENT_AMBULATORY_CARE_PROVIDER_SITE_OTHER): Payer: 59 | Admitting: Physician Assistant

## 2011-11-19 VITALS — BP 126/90 | HR 64 | Ht 66.0 in | Wt 206.0 lb

## 2011-11-19 DIAGNOSIS — I2510 Atherosclerotic heart disease of native coronary artery without angina pectoris: Secondary | ICD-10-CM

## 2011-11-19 DIAGNOSIS — E785 Hyperlipidemia, unspecified: Secondary | ICD-10-CM

## 2011-11-19 DIAGNOSIS — R079 Chest pain, unspecified: Secondary | ICD-10-CM

## 2011-11-19 MED ORDER — PRAVASTATIN SODIUM 20 MG PO TABS: 20.0000 mg | ORAL_TABLET | Freq: Every evening | ORAL | Status: DC

## 2011-11-19 NOTE — Patient Instructions (Addendum)
CANCELLED STRESS TEST TODAY FOR 11/23/11 PER DR. Eden Emms AND Tereso Newcomer, [facility]   Your physician recommends that you return for lab work in: 12/28/11 FASTING LIPID AND LIVER PANEL  PLEASE MAKE APPOINTMENT TO SEE DR. CRENSHAW IN ABOUT 6 WEEKS  START PRAVASTATIN 20 MG 1 TABLET EVERY NIGHT

## 2011-11-19 NOTE — Progress Notes (Signed)
[address] [place], [place]  [address] Phone: [phone] Fax:  [phone]  Date:  11/19/2011   Name:  Brad Barrera   DOB:  February 17, 1971   MRN:  [id number]  PCP:  Rene Paci, MD  Primary Cardiologist:  Dr. Olga Millers  Primary Electrophysiologist:  None    History of Present Illness: Brad Barrera is a 41 y.o. male who returns for post ED visit follow up.  Last seen by Dr. Jens Som in 2010.  Evaluated at that time for left upper extremity pain and palpitations.  Prior stress echo 07/2006 with normal LV function and no ischemia.  Other history includes obesity, hypertension, GERD, glucose intolerance.  Stress echo arranged in 04/2009.  Recently evaluated in the emergency room 11/09/11 with chest pain.  Cardiac markers normal.  Cardiac CT and he colon proximal LAD 0-25%, mid LAD 0-25%, circumflex normal, RCA normal, calcium score 0.  He saw his PCP in followup on 6/12.  He had chest pain felt to be related mainly to stress and anxiety given numerous stressors.  He was however referred for stress testing.  Chest pain was heavy, substernal and radiated to his arm and shoulder.  His CEs were negative.  ECG was reviewed and was also normal.  He continues to have occasional CP with emotional stress.  He works out several times a week without exertional CP or dyspnea.  No syncope.  No orthopnea, PND, edema.  Thinks he feels better on the Paxil.  Take Xanax with relief of CP.    Wt Readings from Last 3 Encounters:  11/19/11 206 lb (93.441 kg)  11/11/11 207 lb 12.8 oz (94.257 kg)  11/08/11 204 lb (92.534 kg)     Potassium  Date/Time Value Range Status  11/08/2011 10:14 AM 4.1  3.5 - 5.1 mEq/L Final     Creatinine, Ser  Date/Time Value Range Status  11/08/2011 10:14 AM 0.67  0.50 - 1.35 mg/dL Final     ALT  Date/Time Value Range Status  08/27/2011  9:23 AM 28  0 - 53 U/L Final     TSH  Date/Time Value Range Status  08/27/2011  9:23 AM 1.95  0.35 - 5.50  uIU/mL Final     Hemoglobin  Date/Time Value Range Status  11/08/2011 10:14 AM 15.1  13.0 - 17.0 g/dL Final    Past Medical History  Diagnosis Date  . Morbid obesity     s/p lap band  . Anal fissure 12/31/2008  . GERD   . HYPERLIPIDEMIA   . HYPERTENSION   . NEPHROLITHIASIS     Current Outpatient Prescriptions  Medication Sig Dispense Refill  . ALPRAZolam (XANAX) 0.5 MG tablet Take 1 tablet (0.5 mg total) by mouth at bedtime as needed for anxiety.  30 tablet  0  . aspirin EC 81 MG tablet Take 1 tablet (81 mg total) by mouth daily.  150 tablet  2  . fluticasone (FLONASE) 50 MCG/ACT nasal spray Place 1 spray into the nose daily as needed. Seasonal allergies  16 g  2  . loratadine (CLARITIN) 10 MG tablet Take 10 mg by mouth daily.      . Multiple Vitamin (MULTIVITAMIN) tablet Take 1 tablet by mouth daily.        Marland Kitchen omeprazole (PRILOSEC) 20 MG capsule Take 1 capsule (20 mg total) by mouth daily.  30 capsule  0  . PARoxetine (PAXIL) 10 MG tablet Take 1 tablet (10 mg total) by mouth every morning.  30  tablet  3  . triamterene-hydrochlorothiazide (DYAZIDE) 37.5-25 MG per capsule TAKE 1 CAPSULE BY MOUTH DAILY  90 capsule  3  . valACYclovir (VALTREX) 1000 MG tablet Take 1 tablet (1,000 mg total) by mouth as needed.  3 tablet  1    Allergies: Allergies  Allergen Reactions  . Ivp Dye (Iodinated Diagnostic Agents) Hives and Swelling    Tingling, scratchy throat, felt throat tightness during IVP study at [facility] a few years ago  . Dilaudid (Hydromorphone Hcl) Hives    Felt like he was on fire  . Other Itching    IVP DYE Tingling in throat  . Peanut-Containing Drug Products     Positive allergy test  . Shellfish-Derived Products     Tingling in throat. Made it feel like throat was closing.    History  Substance Use Topics  . Smoking status: Never Smoker   . Smokeless tobacco: Not on file  . Alcohol Use: No    Family History  Problem Relation Age of Onset  . Coronary artery disease  Mother   . Heart attack Mother   . Hypertension Father   . Heart attack Father   . Prostate cancer Father   . Atrial fibrillation Father   . Hypertension Sister      ROS:  Please see the history of present illness.    All other systems reviewed and negative.   PHYSICAL EXAM: VS:  BP 126/90  Pulse 64  Ht 5\' 6"  (1.676 m)  Wt 206 lb (93.441 kg)  BMI 33.25 kg/m2 Well nourished, well developed, in no acute distress HEENT: normal Neck: no JVD Vascular: no carotid bruits Cardiac:  normal S1, S2; RRR; no murmur Lungs:  clear to auscultation bilaterally, no wheezing, rhonchi or rales Abd: soft, nontender, no hepatomegaly Ext: no edema Skin: warm and dry Neuro:  CNs 2-12 intact, no focal abnormalities noted  EKG:  Sinus rhythm, heart rate 64, normal axis, no acute changes   ASSESSMENT AND PLAN:  1.  Chest pain Non-cardiac. Reviewed CT with Dr. Charlton Haws. No need for stress testing.  Cx test. Suspect related to stress and patient seems to agree.  2.  Minimal, Non-Obstructive Coronary Plaque by CT He has minimal plaque on CT. Has a strong FHx of CAD. Continue with risk factor modification. Continue ASA. Start Statin - Pravastatin 20 mg QHS. Check FLP and LFTs in 6-8 weeks. Follow up with Dr. Olga Millers in 6 mos.  3.  Hypertension Borderline control. Continue to monitor.   Luna Glasgow, PA-C  4:03 PM 11/19/2011

## 2011-11-23 ENCOUNTER — Encounter (HOSPITAL_COMMUNITY): Payer: 59

## 2011-12-07 ENCOUNTER — Ambulatory Visit: Payer: 59 | Admitting: Internal Medicine

## 2012-01-08 ENCOUNTER — Ambulatory Visit: Payer: 59 | Admitting: Cardiology

## 2012-03-22 ENCOUNTER — Other Ambulatory Visit: Payer: Self-pay | Admitting: Internal Medicine

## 2012-03-22 NOTE — Telephone Encounter (Signed)
Rx faxed to [facility].

## 2012-03-22 NOTE — Telephone Encounter (Signed)
Last written 11/11/2011 #30 with 0 refills-please advise.

## 2012-07-28 ENCOUNTER — Other Ambulatory Visit: Payer: Self-pay | Admitting: *Deleted

## 2012-07-28 MED ORDER — ALPRAZOLAM 0.5 MG PO TABS: ORAL_TABLET | ORAL | Status: DC

## 2012-07-28 NOTE — Telephone Encounter (Signed)
MD already left is this ok to refill?..lmb

## 2012-07-28 NOTE — Telephone Encounter (Signed)
[facility]...Raechel Chute

## 2012-08-05 ENCOUNTER — Ambulatory Visit: Payer: 59 | Admitting: Internal Medicine

## 2012-08-08 ENCOUNTER — Ambulatory Visit (INDEPENDENT_AMBULATORY_CARE_PROVIDER_SITE_OTHER): Payer: 59 | Admitting: Internal Medicine

## 2012-08-08 ENCOUNTER — Encounter: Payer: Self-pay | Admitting: Internal Medicine

## 2012-08-08 VITALS — BP 142/90 | HR 75 | Temp 97.7°F | Wt 222.0 lb

## 2012-08-08 DIAGNOSIS — I10 Essential (primary) hypertension: Secondary | ICD-10-CM

## 2012-08-08 DIAGNOSIS — F411 Generalized anxiety disorder: Secondary | ICD-10-CM

## 2012-08-08 DIAGNOSIS — E785 Hyperlipidemia, unspecified: Secondary | ICD-10-CM

## 2012-08-08 DIAGNOSIS — F418 Other specified anxiety disorders: Secondary | ICD-10-CM

## 2012-08-08 DIAGNOSIS — F988 Other specified behavioral and emotional disorders with onset usually occurring in childhood and adolescence: Secondary | ICD-10-CM

## 2012-08-08 DIAGNOSIS — Z0000 Encounter for general adult medical examination without abnormal findings: Secondary | ICD-10-CM

## 2012-08-08 MED ORDER — LOSARTAN POTASSIUM 50 MG PO TABS: 50.0000 mg | ORAL_TABLET | Freq: Every day | ORAL | Status: DC

## 2012-08-08 MED ORDER — AMPHETAMINE-DEXTROAMPHETAMINE 10 MG PO TABS: 10.0000 mg | ORAL_TABLET | Freq: Two times a day (BID) | ORAL | Status: DC

## 2012-08-08 MED ORDER — PAROXETINE HCL 10 MG PO TABS: ORAL_TABLET | ORAL | Status: DC

## 2012-08-08 MED ORDER — TRIAMTERENE-HCTZ 37.5-25 MG PO CAPS: ORAL_CAPSULE | ORAL | Status: DC

## 2012-08-08 NOTE — Assessment & Plan Note (Signed)
Prescribed low-dose pravastatin in summer 2013 after episode of chest pain with concern for CAD given risk factors Medication caused myalgias, especially in legs so has inconsistently taken same Stent removed from medication list at this time pending repeat labs Consider alternate therapy such as atorvastatin if LDL greater than 100 Patient to resume work on diet, exercise with subsequent weight loss as reviewed

## 2012-08-08 NOTE — Assessment & Plan Note (Signed)
previosuly stop med tx given weight loss and good control BP suboptimal control on diuretic only therapy Resume losartan 50 mg daily at this time while resuming work on weight loss with diet and exercise BP Readings from Last 3 Encounters:  08/08/12 142/90  11/19/11 126/90  11/11/11 138/92

## 2012-08-08 NOTE — Assessment & Plan Note (Signed)
Never on formal medications for same before, but reports prior diagnosis while in college No recent behavioral health battery test, but classic symptoms of same present Start low-dose Adderall twice daily at this time, and instructed on appropriate use and possible side effects Followup in the next 4 weeks to review Med effects and symptoms at annual physical with screening labs

## 2012-08-08 NOTE — Assessment & Plan Note (Signed)
Symptoms exacerbated by multiple stressors at school, work Started low-dose paroxetine for same June 2013 -reports mood is overall improved but increasing distractibility manifest with decreased grade point average at school Continue same SSRI and treat for overlapping ADD as below

## 2012-08-08 NOTE — Progress Notes (Signed)
  Subjective:    Patient ID: Brad Barrera, male    DOB: 1971-02-23, 42 y.o.   MRN: [id number]  HPI Here follow up -  Increase anxiety and "distractability" - prior dx ADD in college but never on meds for same  Also reviewed increased weight trend and suboptimal control of blood pressure with current medications  Past Medical History  Diagnosis Date  . Morbid obesity     s/p lap band  . Anal fissure 12/31/2008  . GERD   . HYPERLIPIDEMIA   . HYPERTENSION   . NEPHROLITHIASIS     Review of Systems  Respiratory: Negative for cough and shortness of breath.   Cardiovascular: Negative for chest pain and leg swelling.  Psychiatric/Behavioral: Positive for sleep disturbance and decreased concentration. Negative for suicidal ideas, confusion, self-injury and dysphoric mood. The patient is not nervous/anxious.         Objective:   Physical Exam BP 142/90  Pulse 75  Temp(Src) 97.7 F (36.5 C) (Oral)  Wt 222 lb (100.699 kg)  BMI 35.85 kg/m2  SpO2 96% Wt Readings from Last 3 Encounters:  08/08/12 222 lb (100.699 kg)  11/19/11 206 lb (93.441 kg)  11/11/11 207 lb 12.8 oz (94.257 kg)   Constitutional:  He is overweight, appears well-developed and well-nourished. No distress.  Cardiovascular: Normal rate, regular rhythm and normal heart sounds.  No murmur heard. no BLE edema Pulmonary/Chest: Effort normal and breath sounds normal. No respiratory distress. no wheezes.  Psychiatric: he has an anxious mood and affect. behavior is normal. Judgment and thought content normal.   Lab Results  Component Value Date   WBC 5.9 11/08/2011   HGB 15.1 11/08/2011   HCT 43.2 11/08/2011   PLT 205 11/08/2011   GLUCOSE 89 11/08/2011   CHOL 156 08/27/2011   TRIG 91.0 08/27/2011   HDL 45.70 08/27/2011   LDLCALC 92 08/27/2011   ALT 28 08/27/2011   AST 24 08/27/2011   NA 138 11/08/2011   K 4.1 11/08/2011   CL 103 11/08/2011   CREATININE 0.67 11/08/2011   BUN 20 11/08/2011   CO2 26 11/08/2011   TSH 1.95 08/27/2011   PSA 0.83  08/27/2011   HGBA1C 5.1 12/28/2008       Assessment & Plan:   See problem list. Medications and labs reviewed today.

## 2012-08-08 NOTE — Patient Instructions (Signed)
It was good to see you today. We have reviewed your prior records including labs and tests today Start low dose adderall 10mg  2x/day as discussed - also losartan 50mg  daily for blood pressure  Continue paxil every day with xanax as needed Your prescription(s) have been submitted to your pharmacy. Please take as directed and contact our office if you believe you are having problem(s) with the medication(s). Please schedule followup in 3-4 weeks for physical, call sooner if problems.

## 2012-08-09 ENCOUNTER — Telehealth: Payer: Self-pay | Admitting: Internal Medicine

## 2012-08-09 NOTE — Telephone Encounter (Signed)
Caller: Javoris/Patient; Phone: [phone]; Reason for Call: Patient calling about prior authorization problem.  States seen in office 08/08/12 and given Rx for Adderall.  States pharmacy is unable to fill it because insurance will not cover without prior authorization.  Info to office for staff/provider review/pharmacy callback.  Uses [facility].  May reach patient at [phone].

## 2012-08-09 NOTE — Telephone Encounter (Signed)
Received PA form has been completed & fax back to insurance. Waiting on approval status. Notified pt with status...Brad Barrera

## 2012-08-15 NOTE — Telephone Encounter (Signed)
Notified pharmacy gave approval status. Called pt back no answer LMOM med has been approve.Marland Kitchenlmb

## 2012-08-29 ENCOUNTER — Emergency Department (HOSPITAL_BASED_OUTPATIENT_CLINIC_OR_DEPARTMENT_OTHER): Payer: 59

## 2012-08-29 ENCOUNTER — Emergency Department (HOSPITAL_BASED_OUTPATIENT_CLINIC_OR_DEPARTMENT_OTHER)
Admission: EM | Admit: 2012-08-29 | Discharge: 2012-08-29 | Disposition: A | Discharge status: Home / Self Care | Payer: 59 | Attending: Emergency Medicine | Admitting: Emergency Medicine

## 2012-08-29 ENCOUNTER — Encounter (HOSPITAL_BASED_OUTPATIENT_CLINIC_OR_DEPARTMENT_OTHER): Payer: Self-pay | Admitting: *Deleted

## 2012-08-29 DIAGNOSIS — R059 Cough, unspecified: Secondary | ICD-10-CM | POA: Insufficient documentation

## 2012-08-29 DIAGNOSIS — N200 Calculus of kidney: Secondary | ICD-10-CM

## 2012-08-29 DIAGNOSIS — I10 Essential (primary) hypertension: Secondary | ICD-10-CM | POA: Insufficient documentation

## 2012-08-29 DIAGNOSIS — R0682 Tachypnea, not elsewhere classified: Secondary | ICD-10-CM | POA: Insufficient documentation

## 2012-08-29 DIAGNOSIS — Z8639 Personal history of other endocrine, nutritional and metabolic disease: Secondary | ICD-10-CM | POA: Insufficient documentation

## 2012-08-29 DIAGNOSIS — Z862 Personal history of diseases of the blood and blood-forming organs and certain disorders involving the immune mechanism: Secondary | ICD-10-CM | POA: Insufficient documentation

## 2012-08-29 DIAGNOSIS — F411 Generalized anxiety disorder: Secondary | ICD-10-CM | POA: Insufficient documentation

## 2012-08-29 DIAGNOSIS — Z9089 Acquired absence of other organs: Secondary | ICD-10-CM | POA: Insufficient documentation

## 2012-08-29 DIAGNOSIS — Z79899 Other long term (current) drug therapy: Secondary | ICD-10-CM | POA: Insufficient documentation

## 2012-08-29 DIAGNOSIS — N201 Calculus of ureter: Secondary | ICD-10-CM

## 2012-08-29 DIAGNOSIS — R110 Nausea: Secondary | ICD-10-CM | POA: Insufficient documentation

## 2012-08-29 DIAGNOSIS — R300 Dysuria: Secondary | ICD-10-CM | POA: Insufficient documentation

## 2012-08-29 DIAGNOSIS — Z8719 Personal history of other diseases of the digestive system: Secondary | ICD-10-CM | POA: Insufficient documentation

## 2012-08-29 DIAGNOSIS — R05 Cough: Secondary | ICD-10-CM | POA: Insufficient documentation

## 2012-08-29 DIAGNOSIS — R61 Generalized hyperhidrosis: Secondary | ICD-10-CM | POA: Insufficient documentation

## 2012-08-29 LAB — URINALYSIS, ROUTINE W REFLEX MICROSCOPIC
Bilirubin Urine: NEGATIVE
Nitrite: NEGATIVE
Specific Gravity, Urine: 1.016 (ref 1.005–1.030)
Urobilinogen, UA: 0.2 mg/dL (ref 0.0–1.0)

## 2012-08-29 LAB — URINE MICROSCOPIC-ADD ON

## 2012-08-29 MED ORDER — FENTANYL CITRATE 0.05 MG/ML IJ SOLN
INTRAMUSCULAR | Status: AC
  Administered 2012-08-29: 100 ug via INTRAVENOUS
  Filled 2012-08-29: qty 2

## 2012-08-29 MED ORDER — ONDANSETRON 4 MG PO TBDP
4.0000 mg | ORAL_TABLET | Freq: Once | ORAL | Status: AC
  Administered 2012-08-29: 4 mg via ORAL
  Filled 2012-08-29: qty 1

## 2012-08-29 MED ORDER — MORPHINE SULFATE 4 MG/ML IJ SOLN
4.0000 mg | Freq: Once | INTRAMUSCULAR | Status: AC
  Administered 2012-08-29: 4 mg via INTRAVENOUS
  Filled 2012-08-29: qty 1

## 2012-08-29 MED ORDER — ONDANSETRON HCL 4 MG PO TABS: 4.0000 mg | ORAL_TABLET | Freq: Four times a day (QID) | ORAL | Status: DC

## 2012-08-29 MED ORDER — SODIUM CHLORIDE 0.9 % IV BOLUS (SEPSIS)
1000.0000 mL | Freq: Once | INTRAVENOUS | Status: AC
  Administered 2012-08-29: 1000 mL via INTRAVENOUS

## 2012-08-29 MED ORDER — OXYCODONE-ACETAMINOPHEN 5-325 MG PO TABS: 1.0000 | ORAL_TABLET | Freq: Four times a day (QID) | ORAL | Status: DC | PRN

## 2012-08-29 MED ORDER — MORPHINE SULFATE 4 MG/ML IJ SOLN
4.0000 mg | Freq: Once | INTRAMUSCULAR | Status: AC
  Administered 2012-08-29: 4 mg via INTRAMUSCULAR
  Filled 2012-08-29: qty 1

## 2012-08-29 MED ORDER — FENTANYL CITRATE 0.05 MG/ML IJ SOLN
INTRAMUSCULAR | Status: AC
  Filled 2012-08-29: qty 2

## 2012-08-29 MED ORDER — ONDANSETRON HCL 4 MG/2ML IJ SOLN
4.0000 mg | Freq: Once | INTRAMUSCULAR | Status: AC
Start: 2012-08-29 — End: 2012-08-29

## 2012-08-29 MED ORDER — FENTANYL CITRATE 0.05 MG/ML IJ SOLN
100.0000 ug | Freq: Once | INTRAMUSCULAR | Status: AC
  Administered 2012-08-29: 100 ug via INTRAVENOUS

## 2012-08-29 MED ORDER — ONDANSETRON HCL 4 MG/2ML IJ SOLN
INTRAMUSCULAR | Status: AC
  Administered 2012-08-29: 4 mg via INTRAVENOUS
  Filled 2012-08-29: qty 2

## 2012-08-29 MED ORDER — FENTANYL CITRATE 0.05 MG/ML IJ SOLN: 100.0000 ug | Freq: Once | INTRAMUSCULAR | Status: AC

## 2012-08-29 MED ORDER — KETOROLAC TROMETHAMINE 30 MG/ML IJ SOLN
30.0000 mg | Freq: Once | INTRAMUSCULAR | Status: AC
  Administered 2012-08-29: 30 mg via INTRAVENOUS
  Filled 2012-08-29: qty 1

## 2012-08-29 NOTE — ED Notes (Signed)
Right flank pain x 30 mins. Hx of kidney stones.

## 2012-08-29 NOTE — ED Provider Notes (Signed)
History     CSN: [id number]  Arrival date & time 08/29/12  1255   First MD Initiated Contact with Patient 08/29/12 1300      Chief Complaint  Patient presents with  . Flank Pain    (Consider location/radiation/quality/duration/timing/severity/associated sxs/prior treatment) HPI Comments: 42 y/o male presents to the ED with his husband complaining of sudden onset right sided flank pain x 45 minutes. States he was out for a jog when the pain came on, described as sharp, radiating down right groin area rated 10/10. He tried to go home and lay on the couch without relief. Went to the bathroom and could not urinate. Prior to onset states he was feeling perfectly fine without any urinary symptoms. He has a positive history of kidney stones, the last being in 2012 when Dr. Wilson Singer had to perform lithotripsy on an 8mm stone. Patient states this pain feels the same. Admits to associated nausea and diaphoresis. Denies fever, chills, vomiting.  Patient is a 42 y.o. male presenting with flank pain. The history is provided by the patient and the spouse.  Flank Pain Associated symptoms include diaphoresis and nausea. Pertinent negatives include no chills, fever or vomiting.    Past Medical History  Diagnosis Date  . Morbid obesity     s/p lap band 03/2010  . Anal fissure 12/31/2008  . GERD   . HYPERLIPIDEMIA   . HYPERTENSION   . NEPHROLITHIASIS     Past Surgical History  Procedure Laterality Date  . Tonsillectomy  1991  . Closed reduction wrist fracture      @ age 24  . Laparoscopic gastric banding  03/18/2010  . Cholecystectomy  10/2010    Family History  Problem Relation Age of Onset  . Coronary artery disease Mother   . Heart attack Mother   . Hypertension Father   . Heart attack Father   . Prostate cancer Father   . Atrial fibrillation Father   . Hypertension Sister     History  Substance Use Topics  . Smoking status: Never Smoker   . Smokeless tobacco: Not on file  . Alcohol  Use: No      Review of Systems  Constitutional: Positive for diaphoresis. Negative for fever and chills.  Gastrointestinal: Positive for nausea. Negative for vomiting.  Genitourinary: Positive for flank pain and difficulty urinating.  All other systems reviewed and are negative.    Allergies  Ivp dye; Dilaudid; Other; Peanut-containing drug products; and Shellfish-derived products  Home Medications   Current Outpatient Rx  Name  Route  Sig  Dispense  Refill  . ALPRAZolam (XANAX) 0.5 MG tablet      TAKE ONE TABLET BY MOUTH AT BEDTIME AS NEEDED FOR ANXIETY   30 tablet   0   . amphetamine-dextroamphetamine (ADDERALL) 10 MG tablet   Oral   Take 1 tablet (10 mg total) by mouth 2 (two) times daily.   60 tablet   0   . aspirin EC 81 MG tablet   Oral   Take 1 tablet (81 mg total) by mouth daily.   150 tablet   2   . EXPIRED: fluticasone (FLONASE) 50 MCG/ACT nasal spray   Nasal   Place 1 spray into the nose daily as needed. Seasonal allergies   16 g   2   . loratadine (CLARITIN) 10 MG tablet   Oral   Take 10 mg by mouth daily.         Marland Kitchen losartan (COZAAR) 50 MG tablet  Oral   Take 1 tablet (50 mg total) by mouth daily.   90 tablet   3   . Multiple Vitamin (MULTIVITAMIN) tablet   Oral   Take 1 tablet by mouth daily.           Marland Kitchen PARoxetine (PAXIL) 10 MG tablet      TAKE 1 TABLET (10 MG TOTAL) BY MOUTH EVERY MORNING.   90 tablet   1   . triamterene-hydrochlorothiazide (DYAZIDE) 37.5-25 MG per capsule      TAKE 1 CAPSULE BY MOUTH DAILY   90 capsule   1   . valACYclovir (VALTREX) 1000 MG tablet   Oral   Take 1 tablet (1,000 mg total) by mouth as needed.   3 tablet   1     BP 161/114  Pulse 84  Temp(Src) 97.8 F (36.6 C) (Oral)  Resp 30  Wt 222 lb (100.699 kg)  BMI 35.85 kg/m2  SpO2 100%  Physical Exam  Nursing note and vitals reviewed. Constitutional: He is oriented to person, place, and time. He appears well-developed and well-nourished.  He appears distressed.  HENT:  Head: Normocephalic and atraumatic.  Mouth/Throat: Oropharynx is clear and moist.  Eyes: Conjunctivae are normal.  Neck: Normal range of motion. Neck supple.  Cardiovascular: Normal rate, regular rhythm and normal heart sounds.   Pulmonary/Chest: Tachypnea noted. No respiratory distress. He has no decreased breath sounds. He has no wheezes. He has no rhonchi. He has no rales.  Abdominal: Soft. Normal appearance and bowel sounds are normal. He exhibits no distension. There is tenderness. There is guarding and CVA tenderness (right). There is no rigidity and no rebound.    Musculoskeletal: Normal range of motion. He exhibits no edema.  Neurological: He is alert and oriented to person, place, and time.  Skin: Skin is warm.  Clammy  Psychiatric: His mood appears anxious. His speech is rapid and/or pressured.    ED Course  Procedures (including critical care time)  Labs Reviewed  URINALYSIS, ROUTINE W REFLEX MICROSCOPIC - Abnormal; Notable for the following:    Hgb urine dipstick MODERATE (*)    All other components within normal limits  URINE MICROSCOPIC-ADD ON - Abnormal; Notable for the following:    Bacteria, UA FEW (*)    All other components within normal limits   Ct Abdomen Pelvis Wo Contrast  08/29/2012  *RADIOLOGY REPORT*  Clinical Data: Bilateral flank pain.  CT ABDOMEN AND PELVIS WITHOUT CONTRAST  Technique:  Multidetector CT imaging of the abdomen and pelvis was performed following the standard protocol without intravenous contrast.  Comparison: 02/07/2010.  Findings: Lung bases show no acute findings.  Heart size normal. No pericardial or pleural effusion.  Low attenuation lesions in the liver measure up to 1.6 cm, as before.  Cholecystectomy.  Adrenal glands are unremarkable.  Small stones are seen in the kidneys bilaterally.  A 5 mm stone is seen in the proximal right ureter with mild right hydronephrosis. Associated mild perinephric and  periureteric edema.  Left ureter is decompressed.  Spleen and pancreas are unremarkable.  Microscopic gastric band is seen in the epigastric region.  Stomach and bowel are otherwise unremarkable.  Small right inguinal hernia contains fat.  No pathologically enlarged lymph nodes.  No free fluid.  No worrisome lytic or sclerotic lesions.  IMPRESSION:  1.  Mildly obstructing 5 mm proximal right ureteral stone. 2.  Bilateral renal stones.   Original Report Authenticated By: Leanna Battles, M.D.      1.  Right ureteral stone   2. Kidney stones       MDM  42 y/o male with 5 mm mildly obstructing proximal right ureteral stone. Difficult to control patient's pain. He has had 8 mg morphine and fentanyl without any relief. Nausea controlled with zofran. Will admit patient to urology. Case discussed with Dr. Bernette Mayers who agrees with plan of care.  2:58 PM Spoke with Dr. Wilson Singer who looked at the CT and advised giving him toradol and re-assessing. Due to time of day lithotripsy is unlikely to be done today, however if his pain is not controlled after toradol he will be admitted to [facility]. If pain controlled, he will see patient in office tomorrow.  3:56 PM Pain controlled with toradol. Patient feels as if he can go home. Rx for percocet and zofran given. He will call Dr. Jeralyn Ruths office to schedule an appointment for tomorrow. Aware to return to [facility] if symptoms worsen. Return precautions discussed. Patient states understanding of plan and is agreeable.   Trevor Mace, PA-C 08/29/12 1557

## 2012-08-29 NOTE — ED Provider Notes (Signed)
Medical screening examination/treatment/procedure(s) were performed by non-physician practitioner and as supervising physician I was immediately available for consultation/collaboration.   Charles B. Bernette Mayers, MD 08/29/12 [phone]

## 2012-08-29 NOTE — ED Notes (Signed)
Family at bedside. Pt OOB to bedside with urinal with assist x 1.

## 2012-08-29 NOTE — ED Notes (Signed)
Family at bedside. CT informed that pt is ready for transport. Spoke with pt concerning pain control. Verbalized understanding of current medication administration/management.

## 2012-08-29 NOTE — ED Notes (Signed)
No relief with IV zofran and Morphine, pt reports prior stone 2 years ago and relief with Stadol.

## 2012-10-17 ENCOUNTER — Encounter: Payer: 59 | Admitting: Internal Medicine

## 2012-10-17 ENCOUNTER — Ambulatory Visit (INDEPENDENT_AMBULATORY_CARE_PROVIDER_SITE_OTHER): Payer: 59 | Admitting: Internal Medicine

## 2012-10-17 ENCOUNTER — Encounter: Payer: Self-pay | Admitting: Internal Medicine

## 2012-10-17 VITALS — BP 132/82 | HR 93 | Temp 98.1°F | Wt 220.0 lb

## 2012-10-17 DIAGNOSIS — F988 Other specified behavioral and emotional disorders with onset usually occurring in childhood and adolescence: Secondary | ICD-10-CM

## 2012-10-17 DIAGNOSIS — I10 Essential (primary) hypertension: Secondary | ICD-10-CM

## 2012-10-17 DIAGNOSIS — M7710 Lateral epicondylitis, unspecified elbow: Secondary | ICD-10-CM

## 2012-10-17 DIAGNOSIS — M7712 Lateral epicondylitis, left elbow: Secondary | ICD-10-CM

## 2012-10-17 MED ORDER — INDOMETHACIN 25 MG PO CAPS: 25.0000 mg | ORAL_CAPSULE | Freq: Two times a day (BID) | ORAL | Status: DC

## 2012-10-17 MED ORDER — AMPHETAMINE-DEXTROAMPHETAMINE 10 MG PO TABS: 10.0000 mg | ORAL_TABLET | Freq: Two times a day (BID) | ORAL | Status: DC

## 2012-10-17 MED ORDER — DICLOFENAC SODIUM 1 % TD GEL: 2.0000 g | Freq: Four times a day (QID) | TRANSDERMAL | Status: DC

## 2012-10-17 MED ORDER — VALACYCLOVIR HCL 1 G PO TABS: 1000.0000 mg | ORAL_TABLET | ORAL | Status: DC | PRN

## 2012-10-17 MED ORDER — TRIAMTERENE-HCTZ 37.5-25 MG PO CAPS: ORAL_CAPSULE | ORAL | Status: DC

## 2012-10-17 NOTE — Progress Notes (Signed)
  Subjective:    Patient ID: Brad Barrera, male    DOB: 1970-07-09, 42 y.o.   MRN: [id number]  HPI  Here for elbow pain, left Ongoing >4 weeks Pain precipitated by overuse - lifting heavy boxes Not improved with prn OTC ibuprofen   Also reviewed interval events - requests refills  Past Medical History  Diagnosis Date  . Morbid obesity     s/p lap band 03/2010  . Anal fissure 12/31/2008  . GERD   . HYPERLIPIDEMIA   . HYPERTENSION   . NEPHROLITHIASIS     Review of Systems  Respiratory: Negative for cough and shortness of breath.   Cardiovascular: Negative for chest pain and leg swelling.  Musculoskeletal: Negative for joint swelling and arthralgias.  Skin: Negative for rash and wound.        Objective:   Physical Exam  BP 132/82  Pulse 93  Temp(Src) 98.1 F (36.7 C) (Oral)  Wt 220 lb (99.791 kg)  BMI 35.53 kg/m2  SpO2 97% Wt Readings from Last 3 Encounters:  10/17/12 220 lb (99.791 kg)  08/29/12 222 lb (100.699 kg)  08/08/12 222 lb (100.699 kg)   Constitutional:  He is overweight, appears well-developed and well-nourished. No distress.  Cardiovascular: Normal rate, regular rhythm and normal heart sounds.  No murmur heard. no BLE edema Pulmonary/Chest: Effort normal and breath sounds normal. No respiratory distress. no wheezes.  Mskel: L Elbow: tender to palpation over lateral epicondyle. Pain with resistance to ECRB extension, wrist extension and supination. FROM, no effusion, redness or swelling. Neurovascularly intact. No open wounds.   Lab Results  Component Value Date   WBC 5.9 11/08/2011   HGB 15.1 11/08/2011   HCT 43.2 11/08/2011   PLT 205 11/08/2011   GLUCOSE 89 11/08/2011   CHOL 156 08/27/2011   TRIG 91.0 08/27/2011   HDL 45.70 08/27/2011   LDLCALC 92 08/27/2011   ALT 28 08/27/2011   AST 24 08/27/2011   NA 138 11/08/2011   K 4.1 11/08/2011   CL 103 11/08/2011   CREATININE 0.67 11/08/2011   BUN 20 11/08/2011   CO2 26 11/08/2011   TSH 1.95 08/27/2011   PSA 0.83 08/27/2011    HGBA1C 5.1 12/28/2008       Assessment & Plan:    L tennis elbow - education provided Systemic NSAIDs (indocin x 1 week, then prn), topical Voltaren gel and tennis elbow band Home PT exercise provided - pt to call if worse or unimproved  Also See problem list. Medications and labs reviewed today.

## 2012-10-17 NOTE — Patient Instructions (Signed)
It was good to see you today. Indocin 2x/day x 1 week, then as needed for pain Apply Voltaren gel to painful joint 4x/day x 10 days, then as needed Other Medications reviewed and updated, no other changes recommended at this time. Lateral Epicondylitis (Tennis Elbow) with Rehab Lateral epicondylitis involves inflammation and pain around the outer portion of the elbow. The pain is caused by inflammation of the tendons in the forearm that bring back (extend) the wrist. Lateral epicondylittis is also called tennis elbow, because it is very common in tennis players. However, it may occur in any individual who extends the wrist repetitively. If lateral epicondylitis is left untreated, it may become a chronic problem. SYMPTOMS   Pain, tenderness, and inflammation on the outer (lateral) side of the elbow.  Pain or weakness with gripping activities.  Pain that increases with wrist twisting motions (playing tennis, using a screwdriver, opening a door or a jar).  Pain with lifting objects, including a coffee cup. CAUSES  Lateral epicondylitis is caused by inflammation of the tendons that extend the wrist. Causes of injury may include:  Repetitive stress and strain on the muscles and tendons that extend the wrist.  Sudden change in activity level or intensity.  Incorrect grip in racquet sports.  Incorrect grip size of racquet (often too large).  Incorrect hitting position or technique (usually backhand, leading with the elbow).  Using a racket that is too heavy. RISK INCREASES WITH:  Sports or occupations that require repetitive and/or strenuous forearm and wrist movements (tennis, squash, racquetball, carpentry).  Poor wrist and forearm strength and flexibility.  Failure to warm up properly before activity.  Resuming activity before healing, rehabilitation, and conditioning are complete. PREVENTION   Warm up and stretch properly before activity.  Maintain physical  fitness:  Strength, flexibility, and endurance.  Cardiovascular fitness.  Wear and use properly fitted equipment.  Learn and use proper technique and have a coach correct improper technique.  Wear a tennis elbow (counterforce) brace. PROGNOSIS  The course of this condition depends on the degree of the injury. If treated properly, acute cases (symptoms lasting less than 4 weeks) are often resolved in 2 to 6 weeks. Chronic (longer lasting cases) often resolve in 3 to 6 months, but may require physical therapy. RELATED COMPLICATIONS   Frequently recurring symptoms, resulting in a chronic problem. Properly treating the problem the first time decreases frequency of recurrence.  Chronic inflammation, scarring tendon degeneration, and partial tendon tear, requiring surgery.  Delayed healing or resolution of symptoms. TREATMENT  Treatment first involves the use of ice and medicine, to reduce pain and inflammation. Strengthening and stretching exercises may help reduce discomfort, if performed regularly. These exercises may be performed at home, if the condition is an acute injury. Chronic cases may require a referral to a physical therapist for evaluation and treatment. Your caregiver may advise a corticosteroid injection, to help reduce inflammation. Rarely, surgery is needed. MEDICATION  If pain medicine is needed, nonsteroidal anti-inflammatory medicines (aspirin and ibuprofen), or other minor pain relievers (acetaminophen), are often advised.  Do not take pain medicine for 7 days before surgery.  Prescription pain relievers may be given, if your caregiver thinks they are needed. Use only as directed and only as much as you need.  Corticosteroid injections may be recommended. These injections should be reserved only for the most severe cases, because they can only be given a certain number of times. HEAT AND COLD  Cold treatment (icing) should be applied for 10  to 15 minutes every 2 to 3  hours for inflammation and pain, and immediately after activity that aggravates your symptoms. Use ice packs or an ice massage.  Heat treatment may be used before performing stretching and strengthening activities prescribed by your caregiver, physical therapist, or athletic trainer. Use a heat pack or a warm water soak. SEEK MEDICAL CARE IF: Symptoms get worse or do not improve in 2 weeks, despite treatment. EXERCISES  RANGE OF MOTION (ROM) AND STRETCHING EXERCISES - Epicondylitis, Lateral (Tennis Elbow) These exercises may help you when beginning to rehabilitate your injury. Your symptoms may go away with or without further involvement from your physician, physical therapist or athletic trainer. While completing these exercises, remember:   Restoring tissue flexibility helps normal motion to return to the joints. This allows healthier, less painful movement and activity.  An effective stretch should be held for at least 30 seconds.  A stretch should never be painful. You should only feel a gentle lengthening or release in the stretched tissue. RANGE OF MOTION  Wrist Flexion, Active-Assisted  Extend your right / left elbow with your fingers pointing down.*  Gently pull the back of your hand towards you, until you feel a gentle stretch on the top of your forearm.  Hold this position for __________ seconds. Repeat __________ times. Complete this exercise __________ times per day.  *If directed by your physician, physical therapist or athletic trainer, complete this stretch with your elbow bent, rather than extended. RANGE OF MOTION  Wrist Extension, Active-Assisted  Extend your right / left elbow and turn your palm upwards.*  Gently pull your palm and fingertips back, so your wrist extends and your fingers point more toward the ground.  You should feel a gentle stretch on the inside of your forearm.  Hold this position for __________ seconds. Repeat __________ times. Complete this  exercise __________ times per day. *If directed by your physician, physical therapist or athletic trainer, complete this stretch with your elbow bent, rather than extended. STRETCH - Wrist Flexion  Place the back of your right / left hand on a tabletop, leaving your elbow slightly bent. Your fingers should point away from your body.  Gently press the back of your hand down onto the table by straightening your elbow. You should feel a stretch on the top of your forearm.  Hold this position for __________ seconds. Repeat __________ times. Complete this stretch __________ times per day.  STRETCH  Wrist Extension   Place your right / left fingertips on a tabletop, leaving your elbow slightly bent. Your fingers should point backwards.  Gently press your fingers and palm down onto the table by straightening your elbow. You should feel a stretch on the inside of your forearm.  Hold this position for __________ seconds. Repeat __________ times. Complete this stretch __________ times per day.  STRENGTHENING EXERCISES - Epicondylitis, Lateral (Tennis Elbow) These exercises may help you when beginning to rehabilitate your injury. They may resolve your symptoms with or without further involvement from your physician, physical therapist or athletic trainer. While completing these exercises, remember:   Muscles can gain both the endurance and the strength needed for everyday activities through controlled exercises.  Complete these exercises as instructed by your physician, physical therapist or athletic trainer. Increase the resistance and repetitions only as guided.  You may experience muscle soreness or fatigue, but the pain or discomfort you are trying to eliminate should never worsen during these exercises. If this pain does get worse, stop  and make sure you are following the directions exactly. If the pain is still present after adjustments, discontinue the exercise until you can discuss the trouble  with your caregiver. STRENGTH Wrist Flexors  Sit with your right / left forearm palm-up and fully supported on a table or countertop. Your elbow should be resting below the height of your shoulder. Allow your wrist to extend over the edge of the surface.  Loosely holding a __________ weight, or a piece of rubber exercise band or tubing, slowly curl your hand up toward your forearm.  Hold this position for __________ seconds. Slowly lower the wrist back to the starting position in a controlled manner. Repeat __________ times. Complete this exercise __________ times per day.  STRENGTH  Wrist Extensors  Sit with your right / left forearm palm-down and fully supported on a table or countertop. Your elbow should be resting below the height of your shoulder. Allow your wrist to extend over the edge of the surface.  Loosely holding a __________ weight, or a piece of rubber exercise band or tubing, slowly curl your hand up toward your forearm.  Hold this position for __________ seconds. Slowly lower the wrist back to the starting position in a controlled manner. Repeat __________ times. Complete this exercise __________ times per day.  STRENGTH - Ulnar Deviators  Stand with a ____________________ weight in your right / left hand, or sit while holding a rubber exercise band or tubing, with your healthy arm supported on a table or countertop.  Move your wrist, so that your pinkie travels toward your forearm and your thumb moves away from your forearm.  Hold this position for __________ seconds and then slowly lower the wrist back to the starting position. Repeat __________ times. Complete this exercise __________ times per day STRENGTH - Radial Deviators  Stand with a ____________________ weight in your right / left hand, or sit while holding a rubber exercise band or tubing, with your injured arm supported on a table or countertop.  Raise your hand upward in front of you or pull up on the rubber  tubing.  Hold this position for __________ seconds and then slowly lower the wrist back to the starting position. Repeat __________ times. Complete this exercise __________ times per day. STRENGTH  Forearm Supinators   Sit with your right / left forearm supported on a table, keeping your elbow below shoulder height. Rest your hand over the edge, palm down.  Gently grip a hammer or a soup ladle.  Without moving your elbow, slowly turn your palm and hand upward to a "thumbs-up" position.  Hold this position for __________ seconds. Slowly return to the starting position. Repeat __________ times. Complete this exercise __________ times per day.  STRENGTH  Forearm Pronators   Sit with your right / left forearm supported on a table, keeping your elbow below shoulder height. Rest your hand over the edge, palm up.  Gently grip a hammer or a soup ladle.  Without moving your elbow, slowly turn your palm and hand upward to a "thumbs-up" position.  Hold this position for __________ seconds. Slowly return to the starting position. Repeat __________ times. Complete this exercise __________ times per day.  STRENGTH - Grip  Grasp a tennis ball, a dense sponge, or a large, rolled sock in your hand.  Squeeze as hard as you can, without increasing any pain.  Hold this position for __________ seconds. Release your grip slowly. Repeat __________ times. Complete this exercise __________ times per day.  STRENGTH - Elbow Extensors, Isometric  Stand or sit upright, on a firm surface. Place your right / left arm so that your palm faces your stomach, and it is at the height of your waist.  Place your opposite hand on the underside of your forearm. Gently push up as your right / left arm resists. Push as hard as you can with both arms, without causing any pain or movement at your right / left elbow. Hold this stationary position for __________ seconds. Gradually release the tension in both arms. Allow your  muscles to relax completely before repeating. Document Released: 05/18/2005 Document Revised: 08/10/2011 Document Reviewed: 08/30/2008 [facility] Patient Information 2013 [place], [place].

## 2012-10-17 NOTE — Assessment & Plan Note (Signed)
previously stopped all med tx with prior weight loss and good control BP suboptimal control on diuretic only therapy Resumed losartan 50 mg daily 07/2012 while resuming work on weight loss with diet and exercise  BP Readings from Last 3 Encounters:  10/17/12 132/82  08/29/12 129/70  08/08/12 142/90

## 2012-10-17 NOTE — Assessment & Plan Note (Signed)
Started low-dose Adderall twice daily 07/2012 - improved symptoms - refill now

## 2012-10-20 ENCOUNTER — Other Ambulatory Visit: Payer: Self-pay | Admitting: Internal Medicine

## 2012-10-31 ENCOUNTER — Telehealth: Payer: Self-pay | Admitting: *Deleted

## 2012-10-31 MED ORDER — AMOXICILLIN-POT CLAVULANATE 875-125 MG PO TABS: 1.0000 | ORAL_TABLET | Freq: Two times a day (BID) | ORAL | Status: AC

## 2012-10-31 NOTE — Telephone Encounter (Signed)
Called pt no answer [facility] rx fax over to med center...lmb

## 2012-10-31 NOTE — Telephone Encounter (Signed)
Left msg on vm have a sinus infection. Want to see would md call in augmentin to his pharmacy...lmb

## 2012-10-31 NOTE — Telephone Encounter (Signed)
done

## 2012-12-01 ENCOUNTER — Telehealth: Payer: Self-pay | Admitting: *Deleted

## 2012-12-01 MED ORDER — CYCLOBENZAPRINE HCL 5 MG PO TABS: 5.0000 mg | ORAL_TABLET | Freq: Three times a day (TID) | ORAL | Status: DC | PRN

## 2012-12-01 NOTE — Telephone Encounter (Signed)
Left msg on vm requesting md to call in muscle relaxant. Pt states he has hurt his back. Not able to come in started new position & working 12 hours...Raechel Chute

## 2012-12-01 NOTE — Telephone Encounter (Signed)
Notified pt md sent flexeril to his pharmacy...lmb

## 2012-12-01 NOTE — Telephone Encounter (Signed)
Ok - erx flexeril done

## 2012-12-06 ENCOUNTER — Telehealth: Payer: Self-pay | Admitting: *Deleted

## 2012-12-06 DIAGNOSIS — A64 Unspecified sexually transmitted disease: Secondary | ICD-10-CM

## 2012-12-06 NOTE — Telephone Encounter (Signed)
Notified pt lab has been ordered.../lmb 

## 2012-12-06 NOTE — Telephone Encounter (Signed)
Ok to add - use code "screen for STD" - thanks

## 2012-12-06 NOTE — Telephone Encounter (Signed)
Left msg on vm 12/05/12 stating coming in Thurs for cpx labs. Wanting to get HIV added as well...Raechel Chute

## 2012-12-08 ENCOUNTER — Other Ambulatory Visit (INDEPENDENT_AMBULATORY_CARE_PROVIDER_SITE_OTHER): Payer: 59

## 2012-12-08 DIAGNOSIS — Z0000 Encounter for general adult medical examination without abnormal findings: Secondary | ICD-10-CM

## 2012-12-08 DIAGNOSIS — A64 Unspecified sexually transmitted disease: Secondary | ICD-10-CM

## 2012-12-08 LAB — BASIC METABOLIC PANEL
Calcium: 8.9 mg/dL (ref 8.4–10.5)
GFR: 106.86 mL/min (ref >60.00)
Glucose, Bld: 83 mg/dL (ref 70–99)
Sodium: 131 mEq/L — ABNORMAL LOW (ref 135–145)

## 2012-12-08 LAB — URINALYSIS, ROUTINE W REFLEX MICROSCOPIC
Leukocytes, UA: NEGATIVE
Nitrite: NEGATIVE
RBC / HPF: NONE SEEN (ref 0–?)
Specific Gravity, Urine: >=1.03 (ref 1.000–1.030)
WBC, UA: NONE SEEN (ref 0–?)
pH: 6 (ref 5.0–8.0)

## 2012-12-08 LAB — CBC WITH DIFFERENTIAL/PLATELET
Basophils Absolute: 0 10*3/uL (ref 0.0–0.1)
Eosinophils Relative: 0.8 % (ref 0.0–5.0)
Hemoglobin: 15.4 g/dL (ref 13.0–17.0)
Lymphocytes Relative: 35.7 % (ref 12.0–46.0)
Monocytes Relative: 6 % (ref 3.0–12.0)
Neutro Abs: 3.3 10*3/uL (ref 1.4–7.7)
Platelets: 234 10*3/uL (ref 150.0–400.0)
RDW: 13 % (ref 11.5–14.6)
WBC: 5.8 10*3/uL (ref 4.5–10.5)

## 2012-12-08 LAB — HIV ANTIBODY (ROUTINE TESTING W REFLEX): HIV: NONREACTIVE

## 2012-12-08 LAB — TSH: TSH: 2.01 u[IU]/mL (ref 0.35–5.50)

## 2012-12-08 LAB — HEPATIC FUNCTION PANEL
ALT: 29 U/L (ref 0–53)
AST: 23 U/L (ref 0–37)
Total Bilirubin: 1 mg/dL (ref 0.3–1.2)
Total Protein: 7.3 g/dL (ref 6.0–8.3)

## 2012-12-08 LAB — LIPID PANEL
Cholesterol: 168 mg/dL (ref 0–200)
HDL: 42.6 mg/dL (ref >39.00)
Triglycerides: 97 mg/dL (ref 0.0–149.0)
VLDL: 19.4 mg/dL (ref 0.0–40.0)

## 2012-12-08 LAB — PSA: PSA: 0.61 ng/mL (ref 0.10–4.00)

## 2012-12-12 ENCOUNTER — Ambulatory Visit: Payer: 59 | Attending: Orthopaedic Surgery | Admitting: Physical Therapy

## 2012-12-12 DIAGNOSIS — IMO0001 Reserved for inherently not codable concepts without codable children: Secondary | ICD-10-CM | POA: Insufficient documentation

## 2012-12-12 DIAGNOSIS — M6281 Muscle weakness (generalized): Secondary | ICD-10-CM | POA: Insufficient documentation

## 2012-12-12 DIAGNOSIS — M545 Low back pain, unspecified: Secondary | ICD-10-CM | POA: Insufficient documentation

## 2012-12-12 DIAGNOSIS — M25559 Pain in unspecified hip: Secondary | ICD-10-CM | POA: Insufficient documentation

## 2012-12-14 ENCOUNTER — Ambulatory Visit (INDEPENDENT_AMBULATORY_CARE_PROVIDER_SITE_OTHER): Payer: 59 | Admitting: Internal Medicine

## 2012-12-14 ENCOUNTER — Ambulatory Visit: Payer: 59 | Admitting: Physical Therapy

## 2012-12-14 ENCOUNTER — Encounter: Payer: Self-pay | Admitting: Internal Medicine

## 2012-12-14 VITALS — BP 120/78 | HR 92 | Temp 98.6°F | Ht 64.0 in | Wt 225.8 lb

## 2012-12-14 DIAGNOSIS — M545 Low back pain, unspecified: Secondary | ICD-10-CM

## 2012-12-14 DIAGNOSIS — F418 Other specified anxiety disorders: Secondary | ICD-10-CM

## 2012-12-14 DIAGNOSIS — Z0000 Encounter for general adult medical examination without abnormal findings: Secondary | ICD-10-CM

## 2012-12-14 DIAGNOSIS — I10 Essential (primary) hypertension: Secondary | ICD-10-CM

## 2012-12-14 DIAGNOSIS — F411 Generalized anxiety disorder: Secondary | ICD-10-CM

## 2012-12-14 DIAGNOSIS — F988 Other specified behavioral and emotional disorders with onset usually occurring in childhood and adolescence: Secondary | ICD-10-CM

## 2012-12-14 MED ORDER — LOSARTAN POTASSIUM 50 MG PO TABS: 50.0000 mg | ORAL_TABLET | Freq: Every day | ORAL | Status: DC

## 2012-12-14 MED ORDER — PAROXETINE HCL 10 MG PO TABS: ORAL_TABLET | ORAL | Status: DC

## 2012-12-14 MED ORDER — AMPHETAMINE-DEXTROAMPHETAMINE 10 MG PO TABS: 10.0000 mg | ORAL_TABLET | Freq: Two times a day (BID) | ORAL | Status: DC

## 2012-12-14 MED ORDER — TRIAMTERENE-HCTZ 37.5-25 MG PO CAPS: ORAL_CAPSULE | ORAL | Status: DC

## 2012-12-14 MED ORDER — ALPRAZOLAM 0.5 MG PO TABS: ORAL_TABLET | ORAL | Status: DC

## 2012-12-14 NOTE — Patient Instructions (Signed)
It was good to see you today.. We have reviewed your prior records including labs and tests today Medications reviewed and updated, no changes recommended at this time. Refill on medication(s) as discussed today. Health Maintenance reviewed - all recommended immunizations and age-appropriate screenings are up-to-date. Continue working with back specialist and physical therapy as ongoing, call if worse or unimproved Continue to monitor your depression/anxiety and call if symptoms worse or unimproved in next 3 months Followup in one year for annual physical and labs, call sooner if problems  Health Maintenance, Males A healthy lifestyle and preventative care can promote health and wellness.  Maintain regular health, dental, and eye exams.  Eat a healthy diet. Foods like vegetables, fruits, whole grains, low-fat dairy products, and lean protein foods contain the nutrients you need without too many calories. Decrease your intake of foods high in solid fats, added sugars, and salt. Get information about a proper diet from your caregiver, if necessary.  Regular physical exercise is one of the most important things you can do for your health. Most adults should get at least 150 minutes of moderate-intensity exercise (any activity that increases your heart rate and causes you to sweat) each week. In addition, most adults need muscle-strengthening exercises on 2 or more days a week.   Maintain a healthy weight. The body mass index (BMI) is a screening tool to identify possible weight problems. It provides an estimate of body fat based on height and weight. Your caregiver can help determine your BMI, and can help you achieve or maintain a healthy weight. For adults 20 years and older:  A BMI below 18.5 is considered underweight.  A BMI of 18.5 to 24.9 is normal.  A BMI of 25 to 29.9 is considered overweight.  A BMI of 30 and above is considered obese.  Maintain normal blood lipids and cholesterol  by exercising and minimizing your intake of saturated fat. Eat a balanced diet with plenty of fruits and vegetables. Blood tests for lipids and cholesterol should begin at age 40 and be repeated every 5 years. If your lipid or cholesterol levels are high, you are over 50, or you are a high risk for heart disease, you may need your cholesterol levels checked more frequently.Ongoing high lipid and cholesterol levels should be treated with medicines, if diet and exercise are not effective.  If you smoke, find out from your caregiver how to quit. If you do not use tobacco, do not start.  If you choose to drink alcohol, do not exceed 2 drinks per day. One drink is considered to be 12 ounces (355 mL) of beer, 5 ounces (148 mL) of wine, or 1.5 ounces (44 mL) of liquor.  Avoid use of street drugs. Do not share needles with anyone. Ask for help if you need support or instructions about stopping the use of drugs.  High blood pressure causes heart disease and increases the risk of stroke. Blood pressure should be checked at least every 1 to 2 years. Ongoing high blood pressure should be treated with medicines if weight loss and exercise are not effective.  If you are 47 to 44 years old, ask your caregiver if you should take aspirin to prevent heart disease.  Diabetes screening involves taking a blood sample to check your fasting blood sugar level. This should be done once every 3 years, after age 8, if you are within normal weight and without risk factors for diabetes. Testing should be considered at a younger age or  be carried out more frequently if you are overweight and have at least 1 risk factor for diabetes.  Colorectal cancer can be detected and often prevented. Most routine colorectal cancer screening begins at the age of 37 and continues through age 18. However, your caregiver may recommend screening at an earlier age if you have risk factors for colon cancer. On a yearly basis, your caregiver may  provide home test kits to check for hidden blood in the stool. Use of a small camera at the end of a tube, to directly examine the colon (sigmoidoscopy or colonoscopy), can detect the earliest forms of colorectal cancer. Talk to your caregiver about this at age 27, when routine screening begins. Direct examination of the colon should be repeated every 5 to 10 years through age 60, unless early forms of pre-cancerous polyps or small growths are found.  Hepatitis C blood testing is recommended for all people born from [birth year] through 1967 and any individual with known risks for hepatitis C.  Healthy men should no longer receive prostate-specific antigen (PSA) blood tests as part of routine cancer screening. Consult with your caregiver about prostate cancer screening.  Testicular cancer screening is not recommended for adolescents or adult males who have no symptoms. Screening includes self-exam, caregiver exam, and other screening tests. Consult with your caregiver about any symptoms you have or any concerns you have about testicular cancer.  Practice safe sex. Use condoms and avoid high-risk sexual practices to reduce the spread of sexually transmitted infections (STIs).  Use sunscreen with a sun protection factor (SPF) of 30 or greater. Apply sunscreen liberally and repeatedly throughout the day. You should seek shade when your shadow is shorter than you. Protect yourself by wearing long sleeves, pants, a wide-brimmed hat, and sunglasses year round, whenever you are outdoors.  Notify your caregiver of new moles or changes in moles, especially if there is a change in shape or color. Also notify your caregiver if a mole is larger than the size of a pencil eraser.  A one-time screening for abdominal aortic aneurysm (AAA) and surgical repair of large AAAs by sound wave imaging (ultrasonography) is recommended for ages 55 to 32 years who are current or former smokers.  Stay current with your  immunizations. Document Released: 11/14/2007 Document Revised: 08/10/2011 Document Reviewed: 10/13/2010 [facility] Patient Information 2014 [place], [place]. Back Exercises Back exercises help treat and prevent back injuries. The goal is to increase your strength in your belly (abdominal) and back muscles. These exercises can also help with flexibility. Start these exercises when told by your doctor. HOME CARE Back exercises include: Pelvic Tilt.  Lie on your back with your knees bent. Tilt your pelvis until the lower part of your back is against the floor. Hold this position 5 to 10 sec. Repeat this exercise 5 to 10 times. Knee to Chest.  Pull 1 knee up against your chest and hold for 20 to 30 seconds. Repeat this with the other knee. This may be done with the other leg straight or bent, whichever feels better. Then, pull both knees up against your chest. Sit-Ups or Curl-Ups.  Bend your knees 90 degrees. Start with tilting your pelvis, and do a partial, slow sit-up. Only lift your upper half 30 to 45 degrees off the floor. Take at least 2 to 3 seonds for each sit-up. Do not do sit-ups with your knees out straight. If partial sit-ups are difficult, simply do the above but with only tightening your belly (abdominal) muscles  and holding it as told. Hip-Lift.  Lie on your back with your knees flexed 90 degrees. Push down with your feet and shoulders as you raise your hips 2 inches off the floor. Hold for 10 seconds, repeat 5 to 10 times. Back Arches.  Lie on your stomach. Prop yourself up on bent elbows. Slowly press on your hands, causing an arch in your low back. Repeat 3 to 5 times. Shoulder-Lifts.  Lie face down with arms beside your body. Keep hips and belly pressed to floor as you slowly lift your head and shoulders off the floor. Do not overdo your exercises. Be careful in the beginning. Exercises may cause you some mild back discomfort. If the pain lasts for more than 15 minutes, stop the  exercises until you see your doctor. Improvement with exercise for back problems is slow.  Document Released: 06/20/2010 Document Revised: 08/10/2011 Document Reviewed: 03/19/2011 [facility] Patient Information 2014 [place], [place].

## 2012-12-14 NOTE — Progress Notes (Signed)
Subjective:    Patient ID: Brad Barrera, male    DOB: [birth year], 42 y.o.   MRN: [id number]  Back Pain Pertinent negatives include no abdominal pain, chest pain, fever or headaches.   This 42 year old male presents today for his annual well visit; he is feeling fine today. Health maintenance, chronic conditions, family history, social history, labs and EKG were reviewed and discussed.  Past Medical History  Diagnosis Date  . Morbid obesity     s/p lap band 03/2010  . Anal fissure 12/31/2008  . GERD   . HYPERLIPIDEMIA   . HYPERTENSION   . NEPHROLITHIASIS    Family History  Problem Relation Age of Onset  . Coronary artery disease Mother   . Heart attack Mother   . Hypertension Father   . Heart attack Father   . Prostate cancer Father   . Atrial fibrillation Father   . Hypertension Sister    History   Social History  . Marital Status: Single    Spouse Name: N/A    Number of Children: N/A  . Years of Education: N/A   Social History Main Topics  . Smoking status: Never Smoker   . Smokeless tobacco: None  . Alcohol Use: No  . Drug Use: No  . Sexually Active:      Comment: EMT, in nursing school. Samed sex domestic partner, Brad Barrera   Other Topics Concern  . None   Social History Narrative  . None     Review of Systems  Constitutional: Negative for fever, appetite change and fatigue.  Eyes: Negative for visual disturbance.  Respiratory: Negative for cough and shortness of breath.   Cardiovascular: Negative for chest pain.  Gastrointestinal: Negative for nausea, vomiting and abdominal pain.  Musculoskeletal: Positive for back pain.  Skin: Negative for rash.  Neurological: Negative for dizziness and headaches.       Objective:   Physical Exam  Vitals reviewed. Constitutional: He is oriented to person, place, and time. He appears well-developed and well-nourished.  HENT:  Head: Normocephalic and atraumatic.  Eyes: EOM are normal. Pupils are equal, round,  and reactive to light.  Neck: Neck supple. No tracheal deviation present. No thyromegaly present.  Cardiovascular: Normal rate and regular rhythm.  Exam reveals no gallop.   No murmur heard. Pulmonary/Chest: Effort normal and breath sounds normal. He has no wheezes. He has no rales.  Abdominal: Soft. Bowel sounds are normal. He exhibits no distension and no mass. There is no tenderness.  Musculoskeletal: He exhibits no edema.  Lymphadenopathy:    He has no cervical adenopathy.  Neurological: He is alert and oriented to person, place, and time. He has normal reflexes. No cranial nerve deficit.  Skin: Skin is warm and dry. No rash noted.  Psychiatric: He has a normal mood and affect. Thought content normal.  Physical Exam  Vitals reviewed. Constitutional: He is oriented to person, place, and time. He appears well-developed and well-nourished.  HENT:  Head: Normocephalic and atraumatic.  Eyes: EOM are normal. Pupils are equal, round, and reactive to light.  Neck: Neck supple. No tracheal deviation present. No thyromegaly present.  Cardiovascular: Normal rate and regular rhythm.  Exam reveals no gallop.   No murmur heard. Respiratory: Effort normal and breath sounds normal. He has no wheezes. He has no rales.  GI: Soft. Bowel sounds are normal. He exhibits no distension and no mass. There is no tenderness.  Musculoskeletal: He exhibits no edema.  Lymphadenopathy:    He  has no cervical adenopathy.  Neurological: He is alert and oriented to person, place, and time. He has normal reflexes. No cranial nerve deficit.  Skin: Skin is warm and dry. No rash noted.  Psychiatric: He has a normal mood and affect. Thought content normal.    Lab Results  Component Value Date   WBC 5.8 12/08/2012   HGB 15.4 12/08/2012   HCT 44.0 12/08/2012   PLT 234.0 12/08/2012   GLUCOSE 83 12/08/2012   CHOL 168 12/08/2012   TRIG 97.0 12/08/2012   HDL 42.60 12/08/2012   LDLCALC 106* 12/08/2012   ALT 29 12/08/2012    AST 23 12/08/2012   NA 131* 12/08/2012   K 3.9 12/08/2012   CL 99 12/08/2012   CREATININE 0.8 12/08/2012   BUN 16 12/08/2012   CO2 35* 12/08/2012   TSH 2.01 12/08/2012   PSA 0.61 12/08/2012   HGBA1C 5.1 12/28/2008        Assessment & Plan:  1. CPX V70.7- Patient is doing well. Reviewed labs, ekg, and health maintenance. Follow up in one year for annual visit and as instructed for chronic conditions.  2. Back pain- Patient has been assessed and treated by orthopedics 1 week ago. Continue with their current recommendations including prednisone taper x 6 days, muscle relaxant, and PT.   3. Hypertension- currently well controlled with BP of 120/78 today. Continue currentl management on losartan and trimaterene-HCTZ   4. ADD- focus is currently good on Adderal.  5. Situational anxiety- currently stable. 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 Continue current treatment with paxil daily and xanax prn. Follow up if low mood continues or worsens.  6. Calculus of kidney- reported that stones have been found in left kidney. Hx same. No current symptoms.     I have personally reviewed this case with PA student. I also personally examined this patient. I agree with history and findings as documented above. I reviewed, discussed and approve of the assessment and plan as listed above. Rene Paci, MD

## 2012-12-14 NOTE — Assessment & Plan Note (Signed)
Symptoms exacerbated by multiple stressors at school, work Started low-dose paroxetine for same June 2013 -reports mood is overall improved but increasing distractibility manifest with decreased grade point average at school Continue same SSRI and treat for overlapping ADD as below

## 2012-12-14 NOTE — Progress Notes (Signed)
Subjective:    Patient ID: Brad Barrera, male    DOB: 11-20-1970, 42 y.o.   MRN: [id number]  HPI patient is here today for annual physical. Patient feels well overall.  reviewed chronic medical issues and interval medical events -   Past Medical History  Diagnosis Date  . Morbid obesity     s/p lap band 03/2010  . Anal fissure 12/31/2008  . GERD   . HYPERLIPIDEMIA   . HYPERTENSION   . NEPHROLITHIASIS    Family History  Problem Relation Age of Onset  . Coronary artery disease Mother   . Heart attack Mother   . Hypertension Father   . Heart attack Father   . Prostate cancer Father   . Atrial fibrillation Father   . Hypertension Sister    History  Substance Use Topics  . Smoking status: Never Smoker   . Smokeless tobacco: Not on file  . Alcohol Use: No    Review of Systems Constitutional: Negative for fever or weight change.  Respiratory: Negative for cough and shortness of breath.   Cardiovascular: Negative for chest pain or palpitations.  Gastrointestinal: Negative for abdominal pain, no bowel changes.  Musculoskeletal: Negative for gait problem or joint swelling. +back pain, ongoing eval/tx with ortho reviewed Skin: Negative for rash.  Neurological: Negative for dizziness or headache.  No other specific complaints in a complete review of systems (except as listed in HPI above).     Objective:   Physical Exam  BP 120/78  Pulse 92  Temp(Src) 98.6 F (37 C) (Oral)  Ht 5\' 4"  (1.626 m)  Wt 225 lb 12.8 oz (102.422 kg)  BMI 38.74 kg/m2  SpO2 96% Wt Readings from Last 3 Encounters:  12/14/12 225 lb 12.8 oz (102.422 kg)  10/17/12 220 lb (99.791 kg)  08/29/12 222 lb (100.699 kg)   Constitutional:  He is overweight, but appears well-developed and well-nourished. No distress. HENT: NCAT, sinus nontender - L septal deviation - TMs clear  Eyes: PERRL, EOMI - no icterus or conjunctivitis Neck: Normal range of motion. Neck supple. No JVD present. No thyromegaly  present.  Cardiovascular: Normal rate, regular rhythm and normal heart sounds.  No murmur heard. no BLE edema Pulmonary/Chest: Effort normal and breath sounds normal. No respiratory distress. no wheezes.  Abdominal: lap band intact, nontender -Soft. Bowel sounds are normal. Patient exhibits no distension. There is no tenderness.  Musculoskeletal: Back: full range of motion of thoracic and lumbar spine. mild tender to palpation over lumbar paraspinal muscle with spasm. Negative straight leg raise. DTR's are symmetrically intact. Sensation intact in all dermatomes of the lower extremities. Full strength to manual muscle testing. patient is able to heel toe walk without difficulty and ambulates with antalgic gait. Neurological: he is alert and oriented to person, place, and time. No cranial nerve deficit. Coordination normal.  Skin: Skin is warm and dry.  No erythema or ulceration.  Psychiatric: he has a normal mood and affect. behavior is normal. Judgment and thought content normal.   Lab Results  Component Value Date   WBC 5.8 12/08/2012   HGB 15.4 12/08/2012   HCT 44.0 12/08/2012   PLT 234.0 12/08/2012   GLUCOSE 83 12/08/2012   CHOL 168 12/08/2012   TRIG 97.0 12/08/2012   HDL 42.60 12/08/2012   LDLCALC 106* 12/08/2012   ALT 29 12/08/2012   AST 23 12/08/2012   NA 131* 12/08/2012   K 3.9 12/08/2012   CL 99 12/08/2012   CREATININE 0.8 12/08/2012  BUN 16 12/08/2012   CO2 35* 12/08/2012   TSH 2.01 12/08/2012   PSA 0.61 12/08/2012   HGBA1C 5.1 12/28/2008   EKG: NSR @ 85 bpm - no arrythmia or ischemic changes, unchanged from prior ECG    Assessment & Plan:   CPX - v70.0 - Patient has been counseled on age-appropriate routine health concerns for screening and prevention. These are reviewed and up-to-date. Immunizations are up-to-date or declined. Labs and ECG reviewed.   Back pain - reviewed ongoing treatment with orthopedics. Improving with change in muscle relaxant and Pred taper x6 days. Continue  working with orthopedics as ongoing, patient call if symptoms worse or unimproved

## 2012-12-14 NOTE — Assessment & Plan Note (Signed)
Started low-dose Adderall twice daily 07/2012 - improved symptoms - refill now

## 2012-12-14 NOTE — Assessment & Plan Note (Signed)
previously stopped all med tx with prior weight loss and good control BP suboptimal control on diuretic only therapy Resumed losartan 50 mg daily 07/2012 while resuming work on weight loss with diet and exercise  BP Readings from Last 3 Encounters:  12/14/12 120/78  10/17/12 132/82  08/29/12 129/70

## 2012-12-19 ENCOUNTER — Ambulatory Visit: Payer: 59 | Admitting: Physical Therapy

## 2012-12-21 ENCOUNTER — Ambulatory Visit: Payer: 59 | Admitting: Physical Therapy

## 2012-12-26 ENCOUNTER — Ambulatory Visit: Payer: 59 | Admitting: Physical Therapy

## 2012-12-27 ENCOUNTER — Encounter: Payer: 59 | Admitting: Physical Therapy

## 2012-12-29 ENCOUNTER — Ambulatory Visit: Payer: 59 | Admitting: Physical Therapy

## 2013-01-06 ENCOUNTER — Telehealth (INDEPENDENT_AMBULATORY_CARE_PROVIDER_SITE_OTHER): Payer: Self-pay | Admitting: Surgery

## 2013-01-06 NOTE — Telephone Encounter (Signed)
01/06/13 lm & mailed recall letter for pt to schedule a bariatric follow-up appt with Dr. Ezzard Standing or the lap band clinic.  DOS: 11/06/10. ls

## 2013-02-27 ENCOUNTER — Encounter: Payer: Self-pay | Admitting: Internal Medicine

## 2013-02-28 MED ORDER — AZITHROMYCIN 250 MG PO TABS: ORAL_TABLET | ORAL | Status: DC

## 2013-04-06 ENCOUNTER — Other Ambulatory Visit: Payer: Self-pay

## 2013-04-26 ENCOUNTER — Ambulatory Visit (INDEPENDENT_AMBULATORY_CARE_PROVIDER_SITE_OTHER): Payer: 59 | Admitting: Internal Medicine

## 2013-04-26 ENCOUNTER — Encounter: Payer: Self-pay | Admitting: Internal Medicine

## 2013-04-26 VITALS — BP 132/88 | HR 90 | Temp 97.3°F | Ht 64.0 in | Wt 220.2 lb

## 2013-04-26 DIAGNOSIS — K644 Residual hemorrhoidal skin tags: Secondary | ICD-10-CM

## 2013-04-26 MED ORDER — VALACYCLOVIR HCL 1 G PO TABS: 1000.0000 mg | ORAL_TABLET | ORAL | Status: DC | PRN

## 2013-04-26 NOTE — Progress Notes (Signed)
Pre visit review using our clinic review tool, if applicable. No additional management support is needed unless otherwise documented below in the visit note. 

## 2013-04-26 NOTE — Patient Instructions (Signed)
It was good to see you today.  Exam today shows a simple skin tag.   Okay to use over-the-counter hydrocortisone cream as needed for irritation.   Call if increasing pain, increasing size, bleeding or other problems  Medications reviewed and updated, referrals as requested  Followup as needed

## 2013-04-26 NOTE — Progress Notes (Signed)
   Subjective:    Patient ID: Brad Barrera, male    DOB: May 09, 1971, 42 y.o.   MRN: [id number]  HPI  Requests skin tag check   also reviewed chronic medical issues and interval medical events  Past Medical History  Diagnosis Date  . Morbid obesity     s/p lap band 03/2010  . Anal fissure 12/31/2008  . GERD   . HYPERLIPIDEMIA   . HYPERTENSION   . NEPHROLITHIASIS     Review of Systems  Constitutional: Negative for fever and fatigue.  Respiratory: Negative for cough and shortness of breath.   Cardiovascular: Negative for chest pain and palpitations.  Neurological: Negative for dizziness and headaches.       Objective:   Physical Exam BP 132/88  Pulse 90  Temp(Src) 97.3 F (36.3 C) (Oral)  Ht 5\' 4"  (1.626 m)  Wt 220 lb 4 oz (99.905 kg)  BMI 37.79 kg/m2  SpO2 96% Wt Readings from Last 3 Encounters:  04/26/13 220 lb 4 oz (99.905 kg)  12/14/12 225 lb 12.8 oz (102.422 kg)  10/17/12 220 lb (99.791 kg)   Constitutional: he appears well-developed and well-nourished. No distress.  Neck: Normal range of motion. Neck supple. No JVD present. No thyromegaly present.  Cardiovascular: Normal rate, regular rhythm and normal heart sounds.  No murmur heard. No BLE edema. Pulmonary/Chest: Effort normal and breath sounds normal. No respiratory distress. he has no wheezes.  Skin: small skin tag (3mm) at 4 o'clock position 1cm from anal verge - smooth flesh colored .inflammation or erythema. No bleeding - surrounding skin is warm and dry. No rash noted. No erythema.  Psychiatric: he has a normal mood and affect. behavior is normal. Judgment and thought content normal.  Lab Results  Component Value Date   WBC 5.8 12/08/2012   HGB 15.4 12/08/2012   HCT 44.0 12/08/2012   PLT 234.0 12/08/2012   GLUCOSE 83 12/08/2012   CHOL 168 12/08/2012   TRIG 97.0 12/08/2012   HDL 42.60 12/08/2012   LDLCALC 106* 12/08/2012   ALT 29 12/08/2012   AST 23 12/08/2012   NA 131* 12/08/2012   K 3.9 12/08/2012   CL 99  12/08/2012   CREATININE 0.8 12/08/2012   BUN 16 12/08/2012   CO2 35* 12/08/2012   TSH 2.01 12/08/2012   PSA 0.61 12/08/2012   HGBA1C 5.1 12/28/2008        Assessment & Plan:   Perirectal skin tag -  Education, reassurance provided Recommend hydrocortisone as needed for irritation

## 2013-05-10 ENCOUNTER — Encounter: Payer: Self-pay | Admitting: Internal Medicine

## 2013-05-11 MED ORDER — PAROXETINE HCL 20 MG PO TABS: 20.0000 mg | ORAL_TABLET | Freq: Every day | ORAL | Status: DC

## 2013-11-02 ENCOUNTER — Other Ambulatory Visit: Payer: Self-pay | Admitting: *Deleted

## 2013-11-02 MED ORDER — EPINEPHRINE 0.3 MG/0.3ML IJ SOAJ: 0.3000 mg | Freq: Once | INTRAMUSCULAR | Status: DC | PRN

## 2013-12-07 ENCOUNTER — Ambulatory Visit: Payer: 59 | Admitting: Internal Medicine

## 2013-12-20 ENCOUNTER — Ambulatory Visit (INDEPENDENT_AMBULATORY_CARE_PROVIDER_SITE_OTHER): Payer: BC Managed Care – PPO | Admitting: Internal Medicine

## 2013-12-20 ENCOUNTER — Other Ambulatory Visit (INDEPENDENT_AMBULATORY_CARE_PROVIDER_SITE_OTHER): Payer: BC Managed Care – PPO

## 2013-12-20 ENCOUNTER — Encounter: Payer: Self-pay | Admitting: Internal Medicine

## 2013-12-20 VITALS — BP 126/84 | HR 72 | Temp 98.6°F | Ht 65.0 in | Wt 221.2 lb

## 2013-12-20 DIAGNOSIS — R6882 Decreased libido: Secondary | ICD-10-CM

## 2013-12-20 DIAGNOSIS — E739 Lactose intolerance, unspecified: Secondary | ICD-10-CM

## 2013-12-20 DIAGNOSIS — Z0000 Encounter for general adult medical examination without abnormal findings: Secondary | ICD-10-CM

## 2013-12-20 LAB — URINALYSIS, ROUTINE W REFLEX MICROSCOPIC
BILIRUBIN URINE: NEGATIVE
Hgb urine dipstick: NEGATIVE
Ketones, ur: NEGATIVE
Leukocytes, UA: NEGATIVE
Nitrite: NEGATIVE
RBC / HPF: NONE SEEN (ref 0–?)
Specific Gravity, Urine: 1.01 (ref 1.000–1.030)
Total Protein, Urine: NEGATIVE
URINE GLUCOSE: NEGATIVE
UROBILINOGEN UA: 0.2 (ref 0.0–1.0)
WBC UA: NONE SEEN (ref 0–?)
pH: 7.5 (ref 5.0–8.0)

## 2013-12-20 LAB — HEPATIC FUNCTION PANEL
ALBUMIN: 4.3 g/dL (ref 3.5–5.2)
ALT: 29 U/L (ref 0–53)
AST: 27 U/L (ref 0–37)
Alkaline Phosphatase: 56 U/L (ref 39–117)
Bilirubin, Direct: 0.2 mg/dL (ref 0.0–0.3)
Total Bilirubin: 1.2 mg/dL (ref 0.2–1.2)
Total Protein: 7.6 g/dL (ref 6.0–8.3)

## 2013-12-20 LAB — BASIC METABOLIC PANEL
BUN: 14 mg/dL (ref 6–23)
CALCIUM: 9.4 mg/dL (ref 8.4–10.5)
CHLORIDE: 100 meq/L (ref 96–112)
CO2: 28 mEq/L (ref 19–32)
Creatinine, Ser: 0.8 mg/dL (ref 0.4–1.5)
GFR: 109.39 mL/min (ref >60.00)
Glucose, Bld: 87 mg/dL (ref 70–99)
Potassium: 3.9 mEq/L (ref 3.5–5.1)
Sodium: 137 mEq/L (ref 135–145)

## 2013-12-20 LAB — CBC WITH DIFFERENTIAL/PLATELET
Basophils Absolute: 0 10*3/uL (ref 0.0–0.1)
Basophils Relative: 0.6 % (ref 0.0–3.0)
EOS PCT: 1.1 % (ref 0.0–5.0)
Eosinophils Absolute: 0.1 10*3/uL (ref 0.0–0.7)
HEMATOCRIT: 45.8 % (ref 39.0–52.0)
Hemoglobin: 15.8 g/dL (ref 13.0–17.0)
LYMPHS ABS: 2 10*3/uL (ref 0.7–4.0)
Lymphocytes Relative: 33.9 % (ref 12.0–46.0)
MCHC: 34.5 g/dL (ref 30.0–36.0)
MCV: 86.7 fl (ref 78.0–100.0)
Monocytes Absolute: 0.3 10*3/uL (ref 0.1–1.0)
Monocytes Relative: 5.6 % (ref 3.0–12.0)
Neutro Abs: 3.5 10*3/uL (ref 1.4–7.7)
Neutrophils Relative %: 58.8 % (ref 43.0–77.0)
Platelets: 244 10*3/uL (ref 150.0–400.0)
RBC: 5.29 Mil/uL (ref 4.22–5.81)
RDW: 13.6 % (ref 11.5–15.5)
WBC: 6 10*3/uL (ref 4.0–10.5)

## 2013-12-20 LAB — LIPID PANEL
CHOLESTEROL: 202 mg/dL — AB (ref 0–200)
HDL: 48.2 mg/dL (ref >39.00)
LDL Cholesterol: 122 mg/dL — ABNORMAL HIGH (ref 0–99)
NonHDL: 153.8
TRIGLYCERIDES: 158 mg/dL — AB (ref 0.0–149.0)
Total CHOL/HDL Ratio: 4
VLDL: 31.6 mg/dL (ref 0.0–40.0)

## 2013-12-20 LAB — PSA: PSA: 0.59 ng/mL (ref 0.10–4.00)

## 2013-12-20 LAB — HEMOGLOBIN A1C: Hgb A1c MFr Bld: 5.1 % (ref 4.6–6.5)

## 2013-12-20 LAB — TSH: TSH: 1.77 u[IU]/mL (ref 0.35–4.50)

## 2013-12-20 MED ORDER — PAROXETINE HCL 20 MG PO TABS: 20.0000 mg | ORAL_TABLET | Freq: Every day | ORAL | Status: DC

## 2013-12-20 MED ORDER — TRIAMTERENE-HCTZ 37.5-25 MG PO CAPS
ORAL_CAPSULE | ORAL | Status: DC
Start: 2013-12-20 — End: 2022-01-12

## 2013-12-20 MED ORDER — LOSARTAN POTASSIUM 50 MG PO TABS: 50.0000 mg | ORAL_TABLET | Freq: Every day | ORAL | Status: DC

## 2013-12-20 MED ORDER — VALACYCLOVIR HCL 1 G PO TABS: 1000.0000 mg | ORAL_TABLET | ORAL | Status: DC | PRN

## 2013-12-20 NOTE — Progress Notes (Signed)
Subjective:    Patient ID: Brad Barrera, male    DOB: 19-Feb-1971, 43 y.o.   MRN: [id number]  HPI  patient is here today for annual physical. Patient feels well and has no complaints.  Also reviewed chronic medical issues and interval medical events  Past Medical History  Diagnosis Date  . Morbid obesity     s/p lap band 03/2010  . Anal fissure 12/31/2008  . GERD   . HYPERLIPIDEMIA   . HYPERTENSION   . NEPHROLITHIASIS    Family History  Problem Relation Age of Onset  . Coronary artery disease Mother   . Heart attack Mother   . Hypertension Father   . Heart attack Father   . Prostate cancer Father   . Atrial fibrillation Father   . Hypertension Sister    History  Substance Use Topics  . Smoking status: Never Smoker   . Smokeless tobacco: Not on file  . Alcohol Use: No    Review of Systems  Constitutional: Negative for fever, activity change, appetite change, fatigue and unexpected weight change.  Respiratory: Negative for cough, chest tightness, shortness of breath and wheezing.   Cardiovascular: Negative for chest pain, palpitations and leg swelling.  Genitourinary:       Low libido -?testosterone level  Neurological: Negative for dizziness, weakness and headaches.  Psychiatric/Behavioral: Negative for dysphoric mood. The patient is not nervous/anxious.   All other systems reviewed and are negative.      Objective:   Physical Exam  BP 126/84  Pulse 72  Temp(Src) 98.6 F (37 C) (Oral)  Ht 5\' 5"  (1.651 m)  Wt 221 lb 4 oz (100.358 kg)  BMI 36.82 kg/m2  SpO2 94% Wt Readings from Last 3 Encounters:  12/20/13 221 lb 4 oz (100.358 kg)  04/26/13 220 lb 4 oz (99.905 kg)  12/14/12 225 lb 12.8 oz (102.422 kg)   Constitutional: he is MO, and appears well-developed and well-nourished. No distress.  HENT: Head: Normocephalic and atraumatic. Ears: B TMs ok, no erythema or effusion; Nose: Nose normal. Mouth/Throat: Oropharynx is clear and moist. No oropharyngeal  exudate.  Eyes: Conjunctivae and EOM are normal. Pupils are equal, round, and reactive to light. No scleral icterus.  Neck: Normal range of motion. Neck supple. No JVD present. No thyromegaly present.  Cardiovascular: Normal rate, regular rhythm and normal heart sounds.  No murmur heard. No BLE edema. Pulmonary/Chest: Effort normal and breath sounds normal. No respiratory distress. he has no wheezes.  Abdominal: Soft. Bowel sounds are normal. he exhibits no distension. There is no tenderness. no masses GU: defer Musculoskeletal: Normal range of motion, no joint effusions. No gross deformities Neurological: he is alert and oriented to person, place, and time. No cranial nerve deficit. Coordination, balance, strength, speech and gait are normal.  Skin: Skin is warm and dry. No rash noted. No erythema.  Psychiatric: he has a normal mood and affect. behavior is normal. Judgment and thought content normal.   Lab Results  Component Value Date   WBC 5.8 12/08/2012   HGB 15.4 12/08/2012   HCT 44.0 12/08/2012   PLT 234.0 12/08/2012   GLUCOSE 83 12/08/2012   CHOL 168 12/08/2012   TRIG 97.0 12/08/2012   HDL 42.60 12/08/2012   LDLCALC 106* 12/08/2012   ALT 29 12/08/2012   AST 23 12/08/2012   NA 131* 12/08/2012   K 3.9 12/08/2012   CL 99 12/08/2012   CREATININE 0.8 12/08/2012   BUN 16 12/08/2012   CO2 35* 12/08/2012  TSH 2.01 12/08/2012   PSA 0.61 12/08/2012   HGBA1C 5.1 12/28/2008    Ct Abdomen Pelvis Wo Contrast  08/29/2012   *RADIOLOGY REPORT*  Clinical Data: Bilateral flank pain.  CT ABDOMEN AND PELVIS WITHOUT CONTRAST  Technique:  Multidetector CT imaging of the abdomen and pelvis was performed following the standard protocol without intravenous contrast.  Comparison: 02/07/2010.  Findings: Lung bases show no acute findings.  Heart size normal. No pericardial or pleural effusion.  Low attenuation lesions in the liver measure up to 1.6 cm, as before.  Cholecystectomy.  Adrenal glands are unremarkable.   Small stones are seen in the kidneys bilaterally.  A 5 mm stone is seen in the proximal right ureter with mild right hydronephrosis. Associated mild perinephric and periureteric edema.  Left ureter is decompressed.  Spleen and pancreas are unremarkable.  Microscopic gastric band is seen in the epigastric region.  Stomach and bowel are otherwise unremarkable.  Small right inguinal hernia contains fat.  No pathologically enlarged lymph nodes.  No free fluid.  No worrisome lytic or sclerotic lesions.  IMPRESSION:  1.  Mildly obstructing 5 mm proximal right ureteral stone. 2.  Bilateral renal stones.   Original Report Authenticated By: Leanna BattlesMelinda Blietz, M.D.       Assessment & Plan:   CPX/v70.0 - Patient has been counseled on age-appropriate routine health concerns for screening and prevention. These are reviewed and up-to-date. Immunizations are up-to-date or declined. Labs ordered and reviewed.  Problem List Items Addressed This Visit   GLUCOSE INTOLERANCE     Check a1c The patient is asked to make an attempt to improve diet and exercise patterns to aid in medical management of this problem.     Relevant Orders      Hemoglobin A1c   Routine general medical examination at a health care facility - Primary   Relevant Orders      Basic metabolic panel      CBC with Differential      Hepatic function panel      Lipid panel      TSH      Urinalysis, Routine w reflex microscopic      PSA    Other Visit Diagnoses   Decreased libido        Relevant Orders       Testosterone, free, total

## 2013-12-20 NOTE — Assessment & Plan Note (Signed)
Check a1c The patient is asked to make an attempt to improve diet and exercise patterns to aid in medical management of this problem.

## 2013-12-20 NOTE — Progress Notes (Signed)
Pre visit review using our clinic review tool, if applicable. No additional management support is needed unless otherwise documented below in the visit note. 

## 2013-12-20 NOTE — Patient Instructions (Addendum)
It was good to see you today.  We have reviewed your prior records including labs and tests today  Health Maintenance reviewed - all recommended immunizations and age-appropriate screenings are up-to-date.  Test(s) ordered today. Your results will be released to MyChart (or called to you) after review, usually within 72hours after test completion. If any changes need to be made, you will be notified at that same time.  Medications reviewed and updated, no changes recommended at this time.  Please schedule followup in 12 months for annual exam and labs, call sooner if problems.  Health Maintenance A healthy lifestyle and preventative care can promote health and wellness.  Maintain regular health, dental, and eye exams.  Eat a healthy diet. Foods like vegetables, fruits, whole grains, low-fat dairy products, and lean protein foods contain the nutrients you need and are low in calories. Decrease your intake of foods high in solid fats, added sugars, and salt. Get information about a proper diet from your health care provider, if necessary.  Regular physical exercise is one of the most important things you can do for your health. Most adults should get at least 150 minutes of moderate-intensity exercise (any activity that increases your heart rate and causes you to sweat) each week. In addition, most adults need muscle-strengthening exercises on 2 or more days a week.   Maintain a healthy weight. The body mass index (BMI) is a screening tool to identify possible weight problems. It provides an estimate of body fat based on height and weight. Your health care provider can find your BMI and can help you achieve or maintain a healthy weight. For males 20 years and older:  A BMI below 18.5 is considered underweight.  A BMI of 18.5 to 24.9 is normal.  A BMI of 25 to 29.9 is considered overweight.  A BMI of 30 and above is considered obese.  Maintain normal blood lipids and cholesterol by  exercising and minimizing your intake of saturated fat. Eat a balanced diet with plenty of fruits and vegetables. Blood tests for lipids and cholesterol should begin at age 20 and be repeated every 5 years. If your lipid or cholesterol levels are high, you are over age 50, or you are at high risk for heart disease, you may need your cholesterol levels checked more frequently.Ongoing high lipid and cholesterol levels should be treated with medicines if diet and exercise are not working.  If you smoke, find out from your health care provider how to quit. If you do not use tobacco, do not start.  Lung cancer screening is recommended for adults aged 55-80 years who are at high risk for developing lung cancer because of a history of smoking. A yearly low-dose CT scan of the lungs is recommended for people who have at least a 30-pack-year history of smoking and are current smokers or have quit within the past 15 years. A pack year of smoking is smoking an average of 1 pack of cigarettes a day for 1 year (for example, a 30-pack-year history of smoking could mean smoking 1 pack a day for 30 years or 2 packs a day for 15 years). Yearly screening should continue until the smoker has stopped smoking for at least 15 years. Yearly screening should be stopped for people who develop a health problem that would prevent them from having lung cancer treatment.  If you choose to drink alcohol, do not have more than 2 drinks per day. One drink is considered to be 12 oz (  360 mL) of beer, 5 oz (150 mL) of wine, or 1.5 oz (45 mL) of liquor.  Avoid the use of street drugs. Do not share needles with anyone. Ask for help if you need support or instructions about stopping the use of drugs.  High blood pressure causes heart disease and increases the risk of stroke. Blood pressure should be checked at least every 1-2 years. Ongoing high blood pressure should be treated with medicines if weight loss and exercise are not  effective.  If you are 45-79 years old, ask your health care provider if you should take aspirin to prevent heart disease.  Diabetes screening involves taking a blood sample to check your fasting blood sugar level. This should be done once every 3 years after age 45 if you are at a normal weight and without risk factors for diabetes. Testing should be considered at a younger age or be carried out more frequently if you are overweight and have at least 1 risk factor for diabetes.  Colorectal cancer can be detected and often prevented. Most routine colorectal cancer screening begins at the age of 50 and continues through age 75. However, your health care provider may recommend screening at an earlier age if you have risk factors for colon cancer. On a yearly basis, your health care provider may provide home test kits to check for hidden blood in the stool. A small camera at the end of a tube may be used to directly examine the colon (sigmoidoscopy or colonoscopy) to detect the earliest forms of colorectal cancer. Talk to your health care provider about this at age 50 when routine screening begins. A direct exam of the colon should be repeated every 5-10 years through age 75, unless early forms of precancerous polyps or small growths are found.  People who are at an increased risk for hepatitis B should be screened for this virus. You are considered at high risk for hepatitis B if:  You were born in a country where hepatitis B occurs often. Talk with your health care provider about which countries are considered high risk.  Your parents were born in a high-risk country and you have not received a shot to protect against hepatitis B (hepatitis B vaccine).  You have HIV or AIDS.  You use needles to inject street drugs.  You live with, or have sex with, someone who has hepatitis B.  You are a man who has sex with other men (MSM).  You get hemodialysis treatment.  You take certain medicines for  conditions like cancer, organ transplantation, and autoimmune conditions.  Hepatitis C blood testing is recommended for all people born from 1945 through 1965 and any individual with known risk factors for hepatitis C.  Healthy men should no longer receive prostate-specific antigen (PSA) blood tests as part of routine cancer screening. Talk to your health care provider about prostate cancer screening.  Testicular cancer screening is not recommended for adolescents or adult males who have no symptoms. Screening includes self-exam, a health care provider exam, and other screening tests. Consult with your health care provider about any symptoms you have or any concerns you have about testicular cancer.  Practice safe sex. Use condoms and avoid high-risk sexual practices to reduce the spread of sexually transmitted infections (STIs).  You should be screened for STIs, including gonorrhea and chlamydia if:  You are sexually active and are younger than 24 years.  You are older than 24 years, and your health care provider tells you   that you are at risk for this type of infection.  Your sexual activity has changed since you were last screened, and you are at an increased risk for chlamydia or gonorrhea. Ask your health care provider if you are at risk.  If you are at risk of being infected with HIV, it is recommended that you take a prescription medicine daily to prevent HIV infection. This is called pre-exposure prophylaxis (PrEP). You are considered at risk if:  You are a man who has sex with other men (MSM).  You are a heterosexual man who is sexually active with multiple partners.  You take drugs by injection.  You are sexually active with a partner who has HIV.  Talk with your health care provider about whether you are at high risk of being infected with HIV. If you choose to begin PrEP, you should first be tested for HIV. You should then be tested every 3 months for as long as you are taking  PrEP.  Use sunscreen. Apply sunscreen liberally and repeatedly throughout the day. You should seek shade when your shadow is shorter than you. Protect yourself by wearing long sleeves, pants, a wide-brimmed hat, and sunglasses year round whenever you are outdoors.  Tell your health care provider of new moles or changes in moles, especially if there is a change in shape or color. Also, tell your health care provider if a mole is larger than the size of a pencil eraser.  A one-time screening for abdominal aortic aneurysm (AAA) and surgical repair of large AAAs by ultrasound is recommended for men aged 65-75 years who are current or former smokers.  Stay current with your vaccines (immunizations). Document Released: 11/14/2007 Document Revised: 05/23/2013 Document Reviewed: 10/13/2010 [employer] Patient Information 2015 [employer]. This information is not intended to replace advice given to you by your health care provider. Make sure you discuss any questions you have with your health care provider.  

## 2013-12-21 LAB — TESTOSTERONE, FREE, TOTAL, SHBG
Sex Hormone Binding: 38 nmol/L (ref 13–71)
TESTOSTERONE-% FREE: 1.9 % (ref 1.6–2.9)
Testosterone, Free: 102.5 pg/mL (ref 47.0–244.0)
Testosterone: 528 ng/dL (ref 300–890)

## 2014-03-28 ENCOUNTER — Other Ambulatory Visit: Payer: Self-pay

## 2014-03-28 MED ORDER — VALACYCLOVIR HCL 1 G PO TABS: 1000.0000 mg | ORAL_TABLET | ORAL | Status: DC | PRN

## 2014-12-24 ENCOUNTER — Encounter: Payer: BC Managed Care – PPO | Admitting: Internal Medicine

## 2014-12-24 DIAGNOSIS — Z0289 Encounter for other administrative examinations: Secondary | ICD-10-CM

## 2016-03-06 ENCOUNTER — Encounter (HOSPITAL_COMMUNITY): Payer: Self-pay

## 2019-03-04 ENCOUNTER — Ambulatory Visit
Admission: EM | Admit: 2019-03-04 | Discharge: 2019-03-04 | Discharge status: Against Medical Advice | Payer: Managed Care, Other (non HMO)

## 2019-03-04 ENCOUNTER — Other Ambulatory Visit: Payer: Self-pay

## 2021-05-29 ENCOUNTER — Other Ambulatory Visit (HOSPITAL_BASED_OUTPATIENT_CLINIC_OR_DEPARTMENT_OTHER): Payer: Self-pay

## 2021-05-29 MED ORDER — WEGOVY 0.25 MG/0.5ML ~~LOC~~ SOAJ
SUBCUTANEOUS | 0 refills | Status: DC
  Filled 2021-05-29: qty 2, 28d supply, fill #0

## 2021-05-30 ENCOUNTER — Other Ambulatory Visit (HOSPITAL_BASED_OUTPATIENT_CLINIC_OR_DEPARTMENT_OTHER): Payer: Self-pay

## 2021-05-30 MED ORDER — WEGOVY 0.5 MG/0.5ML ~~LOC~~ SOAJ
0.5000 mg | SUBCUTANEOUS | 0 refills | Status: DC
  Filled 2021-05-30: qty 2, 28d supply, fill #0

## 2021-06-11 ENCOUNTER — Other Ambulatory Visit (HOSPITAL_BASED_OUTPATIENT_CLINIC_OR_DEPARTMENT_OTHER): Payer: Self-pay

## 2021-06-11 MED ORDER — WEGOVY 1 MG/0.5ML ~~LOC~~ SOAJ
1.0000 mg | SUBCUTANEOUS | 0 refills | Status: DC
  Filled 2021-06-11: qty 2, 28d supply, fill #0

## 2021-06-16 ENCOUNTER — Other Ambulatory Visit (HOSPITAL_BASED_OUTPATIENT_CLINIC_OR_DEPARTMENT_OTHER): Payer: Self-pay

## 2021-06-21 ENCOUNTER — Encounter (HOSPITAL_BASED_OUTPATIENT_CLINIC_OR_DEPARTMENT_OTHER): Payer: Self-pay

## 2021-06-21 ENCOUNTER — Emergency Department (HOSPITAL_BASED_OUTPATIENT_CLINIC_OR_DEPARTMENT_OTHER): Payer: BC Managed Care – PPO

## 2021-06-21 ENCOUNTER — Other Ambulatory Visit: Payer: Self-pay

## 2021-06-21 ENCOUNTER — Emergency Department (HOSPITAL_BASED_OUTPATIENT_CLINIC_OR_DEPARTMENT_OTHER)
Admission: EM | Admit: 2021-06-21 | Discharge: 2021-06-21 | Disposition: A | Discharge status: Home / Self Care | Payer: BC Managed Care – PPO | Attending: Emergency Medicine | Admitting: Emergency Medicine

## 2021-06-21 DIAGNOSIS — R519 Headache, unspecified: Secondary | ICD-10-CM

## 2021-06-21 DIAGNOSIS — I10 Essential (primary) hypertension: Secondary | ICD-10-CM | POA: Diagnosis not present

## 2021-06-21 DIAGNOSIS — J0111 Acute recurrent frontal sinusitis: Secondary | ICD-10-CM | POA: Insufficient documentation

## 2021-06-21 MED ORDER — AMOXICILLIN-POT CLAVULANATE 875-125 MG PO TABS: 1.0000 | ORAL_TABLET | Freq: Two times a day (BID) | ORAL | 0 refills | Status: DC

## 2021-06-21 NOTE — ED Notes (Addendum)
Pt ready for transport to CT. 

## 2021-06-21 NOTE — ED Notes (Signed)
Patient transported to CT 

## 2021-06-21 NOTE — ED Notes (Signed)
Patient returned from CT

## 2021-06-21 NOTE — ED Provider Notes (Signed)
[name]   CSN: [id number] Arrival date & time: 06/21/21  1756     History  Chief Complaint  Patient presents with   Fever   Chills   Headache   Diplopia    Brad Barrera is a 52 y.o. male with a medical history significant for gastric sleeve operation, hypertension, GERD.  Patient states that beginning on Tuesday he began experiencing a headache that was located in his forehead.  Patient states that the headache has remained consistent and he states it feels like a "vice grip, pressure".  Patient states that since this time he has tried taking Tylenol and Mucinex to alleviate the pain as he suspected it was a sinus headache.  The patient states that with use of this medication he has had mild to moderate relief of his headache however the headache always returns.  The patient was seen for this complaint this past Thursday by his PCP and had a viral panel conducted which was negative for the COVID and flu.  Patient states that he has been febrile with a T-max of 102.6 as well as having visual disturbances which he describes as double vision.  Patient reports he took 1000 mg of Tylenol prior to arrival.  Patient denies shortness of breath, chest pain, abdominal pain, nausea, vomiting, diarrhea, dizziness, weakness.   Fever Associated symptoms: cough and headaches   Associated symptoms: no chest pain, no diarrhea, no nausea, no sore throat and no vomiting   Headache Associated symptoms: cough and fever   Associated symptoms: no abdominal pain, no diarrhea, no dizziness, no nausea, no photophobia, no sore throat, no vomiting and no weakness       Home Medications Prior to Admission medications   Medication Sig Start Date End Date Taking? Authorizing Provider  amoxicillin-clavulanate (AUGMENTIN) 875-125 MG tablet Take 1 tablet by mouth 2 (two) times daily. 06/21/21  Yes Azucena Cecil, PA-C  EPINEPHrine (EPIPEN 2-PAK) 0.3 mg/0.3 mL  IJ SOAJ injection Inject 0.3 mLs (0.3 mg total) into the muscle Once PRN. 11/02/13   Rowe Clack, MD  fluticasone (FLONASE) 50 MCG/ACT nasal spray PLACE 1 SPRAY INTO THE NOSE DAILY AS NEEDED. SEASONAL ALLERGIES 10/20/12   Rowe Clack, MD  loratadine (CLARITIN) 10 MG tablet Take 10 mg by mouth daily.    [provider]  losartan (COZAAR) 50 MG tablet Take 1 tablet (50 mg total) by mouth daily. 12/20/13   Rowe Clack, MD  Multiple Vitamin (MULTIVITAMIN) tablet Take 1 tablet by mouth daily.      [provider]  PARoxetine (PAXIL) 20 MG tablet Take 1 tablet (20 mg total) by mouth daily. 12/20/13   Rowe Clack, MD  Semaglutide-Weight Management [facility]) 0.25 MG/0.5ML SOAJ Inject 0.54mls (0.25mg ) into the skin once a week for four weeks then start 0.5mg  dose 05/29/21     Semaglutide-Weight Management (WEGOVY) 0.5 MG/0.5ML SOAJ Inject 0.5 mg into the skin once a week. 05/29/21     Semaglutide-Weight Management (WEGOVY) 1 MG/0.5ML SOAJ Inject 1 mg into the skin once a week. 06/11/21     triamterene-hydrochlorothiazide (DYAZIDE) 37.5-25 MG per capsule TAKE 1 CAPSULE BY MOUTH DAILY 12/20/13   Rowe Clack, MD  valACYclovir (VALTREX) 1000 MG tablet Take 1 tablet (1,000 mg total) by mouth as needed. 03/28/14   Rowe Clack, MD      Allergies    Ivp dye [iodinated contrast media], Dilaudid [hydromorphone hcl], Other, Peanut-containing drug products, and  Shellfish-derived products    Review of Systems   Review of Systems  Constitutional:  Positive for fever.  HENT:  Negative for sore throat.   Eyes:  Positive for visual disturbance (Double vision). Negative for photophobia.  Respiratory:  Positive for cough. Negative for shortness of breath.   Cardiovascular:  Negative for chest pain.  Gastrointestinal:  Negative for abdominal pain, diarrhea, nausea and vomiting.  Neurological:  Positive for headaches. Negative for dizziness, syncope and weakness.    Physical Exam Updated Vital Signs BP (!) 126/109    Pulse 80    Temp 98.7 F (37.1 C) (Oral)    Resp 16    Ht 5\' 5"  (1.651 m)    Wt 89.8 kg    SpO2 97%    BMI 32.95 kg/m  Physical Exam Vitals and nursing note reviewed.  Constitutional:      General: He is not in acute distress.    Appearance: He is not ill-appearing or toxic-appearing.  HENT:     Head: Normocephalic and atraumatic.  Cardiovascular:     Rate and Rhythm: Normal rate and regular rhythm.  Pulmonary:     Effort: Pulmonary effort is normal. No respiratory distress.     Breath sounds: Normal breath sounds. No wheezing or rales.  Abdominal:     General: Bowel sounds are normal. There is no distension.     Palpations: Abdomen is soft.  Musculoskeletal:        General: Normal range of motion.     Cervical back: Normal range of motion and neck supple. No rigidity.  Lymphadenopathy:     Cervical: No cervical adenopathy.  Skin:    General: Skin is warm and dry.     Capillary Refill: Capillary refill takes less than 2 seconds.  Neurological:     Mental Status: He is alert and oriented to person, place, and time.     GCS: GCS eye subscore is 4. GCS verbal subscore is 5. GCS motor subscore is 6.     Cranial Nerves: Cranial nerves 2-12 are intact. No dysarthria or facial asymmetry.     Sensory: Sensation is intact. No sensory deficit.     Motor: Motor function is intact. No weakness or pronator drift.     Coordination: Coordination is intact. Coordination normal. Finger-Nose-Finger Test and Heel to [facility] Test normal.    ED Results / Procedures / Treatments   Labs (all labs ordered are listed, but only abnormal results are displayed) Labs Reviewed - No data to display  EKG None  Radiology CT Head Wo Contrast  Result Date: 06/21/2021 CLINICAL DATA:  Headache, fever, chills, congestion since Tuesday, double vision EXAM: CT HEAD WITHOUT CONTRAST TECHNIQUE: Contiguous axial images were obtained from the base of the skull  through the vertex without intravenous contrast. RADIATION DOSE REDUCTION: This exam was performed according to the departmental dose-optimization program which includes automated exposure control, adjustment of the mA and/or kV according to patient size and/or use of iterative reconstruction technique. COMPARISON:  None. FINDINGS: Brain: No evidence of acute infarction, hemorrhage, hydrocephalus, extra-axial collection or mass lesion/mass effect. Vascular: No hyperdense vessel or unexpected calcification. Skull: Normal. Negative for fracture or focal lesion. Sinuses/Orbits: No acute finding. Other: None. IMPRESSION: No acute intracranial pathology. No noncontrast CT findings to explain headache or double vision. Electronically Signed   By: Delanna Ahmadi M.D.   On: 06/21/2021 19:11    Procedures Procedures    Medications Ordered in ED Medications - No data to display  ED Course/ Medical Decision Making/ A&P                           Medical Decision Making Amount and/or Complexity of Data Reviewed Radiology: ordered.   51 year old male presents to ED due to headache and double vision.  On examination, the patient's neuro examination does not show any focal deficits.  The patient has 5 out of 5 strength bilaterally to his upper and lower extremities.  The patient has intact sensation bilaterally to upper and lower extremities.    The patient reports to me that his eye doctor has in the past commented that he has a "alignment issue" in terms of his vision that his eye doctor is still trying to "work out".  Patient is not hypertensive, however he does have a diagnosis for hypertension.  The patient states that he has taken his medication today.  Out of an abundance of caution, I decided to CT scan this patient's head to ensure there were no cranial abnormalities noted. I independently interpreted this patient's CT scan which did not show any signs of head bleeding or other intracranial abnormalities  that would account for his double vision.  During the course of my evaluation of this patient, I noticed that this patient had frontal sinus pressure and pain on palpation.  I discussed with this patient if he has ever had a sinus infection before which he states he has.  Is my believe at this time that the patient's frontal headache is related to a suspected sinus infection.  I will place this patient on antibiotics as indicated.  I discussed this patient and his case with Dr. Pearline Cables who is in agreement with current plan to discharge the patient with antibiotic medication for sinusitis.  The patient indicates that he has good follow-up on Monday with his vision doctor to assess for any abnormalities.  The patient also states that he has a good help at home via his partner.  I provided the patient with return precautions and he voiced understanding.  I answered all the questions of the patient to the patient satisfaction.  The patient and I had a shared decision-making conversation about his desire to go home, and he stated that he did not wish to be admitted for further evaluation at this time.  The patient is stable for discharge.    Final Clinical Impression(s) / ED Diagnoses Final diagnoses:  Nonintractable headache, unspecified chronicity pattern, unspecified headache type  Acute recurrent frontal sinusitis    Rx / DC Orders ED Discharge Orders          Ordered    amoxicillin-clavulanate (AUGMENTIN) 875-125 MG tablet  2 times daily        06/21/21 2026              Azucena Cecil, PA-C [id number]    Campbell Stall P, DO [id number]

## 2021-06-21 NOTE — Discharge Instructions (Addendum)
Return to ED with any new or worsening symptoms such as blurred vision, chest pain, shortness of breath Please follow-up with your eye doctor on Monday as we discussed Please take the antibiotics I prescribed to you as discussed

## 2021-06-21 NOTE — ED Notes (Signed)
ED Provider at bedside. 

## 2021-06-21 NOTE — ED Triage Notes (Signed)
Pt c/o fever, chills, congestion, and headache since Tuesday. Pt reports yesterday that he began experiencing double vision in both eyes.

## 2021-08-12 ENCOUNTER — Other Ambulatory Visit (HOSPITAL_BASED_OUTPATIENT_CLINIC_OR_DEPARTMENT_OTHER): Payer: Self-pay

## 2021-08-12 MED ORDER — WEGOVY 2.4 MG/0.75ML ~~LOC~~ SOAJ
SUBCUTANEOUS | 1 refills | Status: DC
  Filled 2021-09-17: qty 3, 28d supply, fill #0
  Filled 2021-10-11: qty 3, 28d supply, fill #1

## 2021-08-12 MED ORDER — WEGOVY 1.7 MG/0.75ML ~~LOC~~ SOAJ
1.7000 mg | SUBCUTANEOUS | 0 refills | Status: DC
  Filled 2021-08-12: qty 3, 28d supply, fill #0

## 2021-09-01 ENCOUNTER — Other Ambulatory Visit (HOSPITAL_BASED_OUTPATIENT_CLINIC_OR_DEPARTMENT_OTHER): Payer: Self-pay

## 2021-09-01 MED ORDER — FLUTICASONE PROPIONATE 50 MCG/ACT NA SUSP
NASAL | 3 refills | Status: AC
  Filled 2021-09-01: qty 16, 60d supply, fill #0
  Filled 2022-03-21: qty 16, 60d supply, fill #1
  Filled 2022-05-17: qty 16, 30d supply, fill #2

## 2021-09-17 ENCOUNTER — Other Ambulatory Visit (HOSPITAL_BASED_OUTPATIENT_CLINIC_OR_DEPARTMENT_OTHER): Payer: Self-pay

## 2021-10-09 ENCOUNTER — Other Ambulatory Visit (HOSPITAL_BASED_OUTPATIENT_CLINIC_OR_DEPARTMENT_OTHER): Payer: Self-pay

## 2021-10-09 MED ORDER — EPINEPHRINE 0.3 MG/0.3ML IJ SOAJ
INTRAMUSCULAR | 3 refills | Status: AC
  Filled 2021-10-09: qty 2, 2d supply, fill #0
  Filled 2022-05-21: qty 2, 2d supply, fill #1

## 2021-10-13 ENCOUNTER — Other Ambulatory Visit (HOSPITAL_BASED_OUTPATIENT_CLINIC_OR_DEPARTMENT_OTHER): Payer: Self-pay

## 2021-10-15 ENCOUNTER — Encounter (INDEPENDENT_AMBULATORY_CARE_PROVIDER_SITE_OTHER): Payer: Self-pay

## 2021-11-03 ENCOUNTER — Other Ambulatory Visit (HOSPITAL_BASED_OUTPATIENT_CLINIC_OR_DEPARTMENT_OTHER): Payer: Self-pay

## 2021-11-03 MED ORDER — WEGOVY 2.4 MG/0.75ML ~~LOC~~ SOAJ
SUBCUTANEOUS | 0 refills | Status: DC
  Filled 2021-11-03: qty 3, 28d supply, fill #0

## 2021-11-04 ENCOUNTER — Other Ambulatory Visit (HOSPITAL_BASED_OUTPATIENT_CLINIC_OR_DEPARTMENT_OTHER): Payer: Self-pay

## 2021-11-21 ENCOUNTER — Other Ambulatory Visit (HOSPITAL_BASED_OUTPATIENT_CLINIC_OR_DEPARTMENT_OTHER): Payer: Self-pay

## 2021-11-21 MED ORDER — WEGOVY 2.4 MG/0.75ML ~~LOC~~ SOAJ
SUBCUTANEOUS | 0 refills | Status: DC
  Filled 2021-11-21 – 2021-11-24 (×2): qty 3, 28d supply, fill #0
  Filled 2021-12-22: qty 3, 28d supply, fill #1
  Filled 2022-01-23: qty 3, 28d supply, fill #2

## 2021-11-24 ENCOUNTER — Other Ambulatory Visit (HOSPITAL_BASED_OUTPATIENT_CLINIC_OR_DEPARTMENT_OTHER): Payer: Self-pay

## 2021-11-24 MED ORDER — LOSARTAN POTASSIUM 50 MG PO TABS
ORAL_TABLET | ORAL | 3 refills | Status: DC
  Filled 2021-11-24: qty 90, 90d supply, fill #0

## 2021-12-03 ENCOUNTER — Ambulatory Visit (INDEPENDENT_AMBULATORY_CARE_PROVIDER_SITE_OTHER): Payer: Self-pay | Admitting: Bariatrics

## 2021-12-10 ENCOUNTER — Ambulatory Visit (INDEPENDENT_AMBULATORY_CARE_PROVIDER_SITE_OTHER): Payer: BC Managed Care – PPO | Admitting: Bariatrics

## 2021-12-10 ENCOUNTER — Ambulatory Visit (INDEPENDENT_AMBULATORY_CARE_PROVIDER_SITE_OTHER): Payer: Self-pay | Admitting: Bariatrics

## 2021-12-17 ENCOUNTER — Ambulatory Visit (INDEPENDENT_AMBULATORY_CARE_PROVIDER_SITE_OTHER): Payer: Self-pay | Admitting: Bariatrics

## 2021-12-22 ENCOUNTER — Other Ambulatory Visit (HOSPITAL_BASED_OUTPATIENT_CLINIC_OR_DEPARTMENT_OTHER): Payer: Self-pay

## 2021-12-22 ENCOUNTER — Encounter (HOSPITAL_BASED_OUTPATIENT_CLINIC_OR_DEPARTMENT_OTHER): Payer: Self-pay

## 2021-12-23 ENCOUNTER — Other Ambulatory Visit (HOSPITAL_BASED_OUTPATIENT_CLINIC_OR_DEPARTMENT_OTHER): Payer: Self-pay

## 2021-12-24 ENCOUNTER — Ambulatory Visit (INDEPENDENT_AMBULATORY_CARE_PROVIDER_SITE_OTHER): Payer: Self-pay | Admitting: Bariatrics

## 2021-12-26 DIAGNOSIS — Z0289 Encounter for other administrative examinations: Secondary | ICD-10-CM

## 2022-01-07 ENCOUNTER — Encounter (INDEPENDENT_AMBULATORY_CARE_PROVIDER_SITE_OTHER): Payer: Self-pay

## 2022-01-12 ENCOUNTER — Ambulatory Visit (INDEPENDENT_AMBULATORY_CARE_PROVIDER_SITE_OTHER): Payer: BC Managed Care – PPO | Admitting: Family Medicine

## 2022-01-12 ENCOUNTER — Encounter (INDEPENDENT_AMBULATORY_CARE_PROVIDER_SITE_OTHER): Payer: Self-pay | Admitting: Family Medicine

## 2022-01-12 VITALS — BP 120/83 | HR 77 | Temp 98.3°F | Ht 63.0 in | Wt 172.0 lb

## 2022-01-12 DIAGNOSIS — R0602 Shortness of breath: Secondary | ICD-10-CM | POA: Diagnosis not present

## 2022-01-12 DIAGNOSIS — Z1331 Encounter for screening for depression: Secondary | ICD-10-CM | POA: Diagnosis not present

## 2022-01-12 DIAGNOSIS — Z6830 Body mass index (BMI) 30.0-30.9, adult: Secondary | ICD-10-CM

## 2022-01-12 DIAGNOSIS — I10 Essential (primary) hypertension: Secondary | ICD-10-CM

## 2022-01-12 DIAGNOSIS — R5383 Other fatigue: Secondary | ICD-10-CM | POA: Diagnosis not present

## 2022-01-12 DIAGNOSIS — E669 Obesity, unspecified: Secondary | ICD-10-CM

## 2022-01-12 DIAGNOSIS — N200 Calculus of kidney: Secondary | ICD-10-CM

## 2022-01-12 DIAGNOSIS — Z9884 Bariatric surgery status: Secondary | ICD-10-CM

## 2022-01-13 LAB — LIPID PANEL WITH LDL/HDL RATIO
Cholesterol, Total: 144 mg/dL (ref 100–199)
HDL: 52 mg/dL (ref >39)
LDL Chol Calc (NIH): 74 mg/dL (ref 0–99)
LDL/HDL Ratio: 1.4 ratio (ref 0.0–3.6)
Triglycerides: 94 mg/dL (ref 0–149)
VLDL Cholesterol Cal: 18 mg/dL (ref 5–40)

## 2022-01-13 LAB — T4, FREE: Free T4: 1.44 ng/dL (ref 0.82–1.77)

## 2022-01-13 LAB — VITAMIN B12: Vitamin B-12: 838 pg/mL (ref 232–1245)

## 2022-01-13 LAB — TSH: TSH: 1.67 u[IU]/mL (ref 0.450–4.500)

## 2022-01-13 LAB — VITAMIN D 25 HYDROXY (VIT D DEFICIENCY, FRACTURES): Vit D, 25-Hydroxy: 109 ng/mL — ABNORMAL HIGH (ref 30.0–100.0)

## 2022-01-13 LAB — HEMOGLOBIN A1C
Est. average glucose Bld gHb Est-mCnc: 91 mg/dL
Hgb A1c MFr Bld: 4.8 % (ref 4.8–5.6)

## 2022-01-13 LAB — INSULIN, RANDOM: INSULIN: 3.2 u[IU]/mL (ref 2.6–24.9)

## 2022-01-13 LAB — TESTOSTERONE: Testosterone: 614 ng/dL (ref 264–916)

## 2022-01-17 ENCOUNTER — Encounter (INDEPENDENT_AMBULATORY_CARE_PROVIDER_SITE_OTHER): Payer: Self-pay

## 2022-01-22 NOTE — Progress Notes (Signed)
Chief Complaint:   OBESITY Brad Barrera (MR# [id number]) is a 51 y.o. male who presents for evaluation and treatment of obesity and related comorbidities. Current BMI is Body mass index is 30.47 kg/m. Brad Barrera has been struggling with his weight for many years and has been unsuccessful in either losing weight, maintaining weight loss, or reaching his healthy weight goal.  Brad Barrera has done well with weight loss and he is status post VSG October 2017 with Dr. Adolphus Barrera, but more recently has loss 37 pounds since November on Wegovy, now at 2.4 mg subcu weekly injection.  Previously tried Qsymia, phentermine, and Saxenda.  Happy with satiety on Wegovy without adverse side effects.  He is averaging 1800 cal/day and 80 g of protein.  Brad Barrera is currently in the action stage of change and ready to dedicate time achieving and maintaining a healthier weight. Brad Barrera is interested in becoming our patient and working on intensive lifestyle modifications including (but not limited to) diet and exercise for weight loss.  Brad Barrera's habits were reviewed today and are as follows: His family eats meals together, he thinks his family will eat healthier with him, his desired weight loss is 12 lbs, he has been heavy most of his life, he started gaining weight in his late 20's, and his heaviest weight ever was 290-300 pounds.  Depression Screen Brad Barrera's Food and Mood (modified PHQ-9) score was 1.     01/12/2022    9:57 AM  Depression screen PHQ 2/9  Decreased Interest 1  Down, Depressed, Hopeless 0  PHQ - 2 Score 1  Altered sleeping 0  Tired, decreased energy 0  Change in appetite 0  Feeling bad or failure about yourself  0  Trouble concentrating 0  Moving slowly or fidgety/restless 0  Suicidal thoughts 0  PHQ-9 Score 1  Difficult doing work/chores Not difficult at all   Subjective:   1. Other fatigue Brad Barrera denies daytime somnolence and denies waking up still tired. Brad Barrera generally gets 6 or 8 hours of  sleep per night, and states that he has generally restful sleep. Snoring is not present. Apneic episodes are not present. Epworth Sleepiness Score is 0.  Reviewed CMP, CBC from 11/24/2021.  Under care everywhere.  2. SOB (shortness of breath) on exertion Brad Barrera notes increasing shortness of breath with exercising and seems to be worsening over time with weight gain. He notes getting out of breath sooner with activity than he used to. This has not gotten worse recently. Brad Barrera denies shortness of breath at rest or orthopnea.  3. Essential hypertension Brad Barrera's blood pressure is well controlled on losartan 50 mg daily.  He denies adverse side effects.  4. S/P laparoscopic sleeve gastrectomy, Brad Barrera Brad Barrera have removal of lap band and revised to VSG with Dr. Adolphus Barrera in October 2017.  He is on a multivitamin and calcium citrate daily (B12, vitamin D too high on 11/24/2021).  5. Kidney stones Brad Barrera increased his water intake to 80 ounces per day.  Assessment/Plan:   1. Other fatigue Brad Barrera does feel that his weight is causing his energy to be lower than it should be. Fatigue may be related to obesity, depression or many other causes. Labs will be ordered, and in the meanwhile, Brad Barrera will focus on self care including making healthy food choices, increasing physical activity and focusing on stress reduction.  - EKG 12-Lead - VITAMIN D 25 Hydroxy (Vit-D Deficiency, Fractures) - TSH - T4, free - Lipid Panel With LDL/HDL Ratio - Insulin,  random - Hemoglobin A1c - Vitamin B12 - Testosterone  2. SOB (shortness of breath) on exertion Brad Barrera does feel that he gets out of breath more easily that he used to when he exercises. Brad Barrera's shortness of breath appears to be obesity related and exercise induced. He has agreed to work on weight loss and gradually increase exercise to treat his exercise induced shortness of breath. Will continue to monitor closely.  3. Essential hypertension Brad Barrera will  continue losartan 50 mg once daily per his PCP.  4. S/P laparoscopic sleeve gastrectomy, Brad Barrera Labs were updated today.  Brad Barrera set weight target is 160 pounds.  5. Kidney stones Brad Barrera is to avoid future use of topiramate.   6. Depression screen Brad Barrera had a negative depression screening. Depression is commonly associated with obesity and often results in emotional eating behaviors. We will monitor this closely and work on CBT to help improve the non-hunger eating patterns. Referral to Psychology may be required if no improvement is seen as he continues in our clinic.  7. Class 1 obesity with serious comorbidity and body mass index (BMI) of 30.0 to 30.9 in adult, unspecified obesity type Brad Barrera is currently in the action stage of change and his goal is to continue with weight loss efforts. I recommend Brad Barrera begin the structured treatment plan as follows:  He has agreed to keeping a food journal and adhering to recommended goals of 1700-1900 calories and 90-100 grams of protein daily.  Brad Barrera will continue Wegovy 2.4 mg subcu, refill not needed today.  Adjust daily protein goal to 90-100 g/day due to drop off in BMR.  Exercise goals: Weight training 3 times per week.    Behavioral modification strategies: increasing lean protein intake, increasing water intake, decreasing eating out, no skipping meals, keeping healthy foods in the home, and decreasing junk food.  He was informed of the importance of frequent follow-up visits to maximize his success with intensive lifestyle modifications for his multiple health conditions. He was informed we would discuss his lab results at his next visit unless there is a critical issue that needs to be addressed sooner. Brad Barrera agreed to keep his next visit at the agreed upon time to discuss these results.  Objective:   Blood pressure 120/83, pulse 77, temperature 98.3 F (36.8 C), height 5\' 3"  (1.6 m), weight 172 lb (78 kg), SpO2 98 %. Body mass index  is 30.47 kg/m.  EKG: Normal sinus rhythm, rate 75 BPM.  Indirect Calorimeter completed today shows a VO2 of 230 and a REE of 1584.  His calculated basal metabolic rate is thus his basal metabolic rate is worse than expected.  General: Cooperative, alert, well developed, in no acute distress. HEENT: Conjunctivae and lids unremarkable. Cardiovascular: Regular rhythm.  Lungs: Normal work of breathing. Neurologic: No focal deficits.   Lab Results  Component Value Date   CREATININE 0.8 12/20/2013   BUN 14 12/20/2013   NA 137 12/20/2013   K 3.9 12/20/2013   CL 100 12/20/2013   CO2 28 12/20/2013   Lab Results  Component Value Date   ALT 29 12/20/2013   AST 27 12/20/2013   ALKPHOS 56 12/20/2013   BILITOT 1.2 12/20/2013   Lab Results  Component Value Date   HGBA1C 4.8 01/12/2022   HGBA1C 5.1 12/20/2013   HGBA1C 5.1 12/28/2008   Lab Results  Component Value Date   INSULIN 3.2 01/12/2022   Lab Results  Component Value Date   TSH 1.670 01/12/2022   Lab Results  Component Value Date   CHOL 144 01/12/2022   HDL 52 01/12/2022   LDLCALC 74 01/12/2022   TRIG 94 01/12/2022   CHOLHDL 4 12/20/2013   Lab Results  Component Value Date   WBC 6.0 12/20/2013   HGB 15.8 12/20/2013   HCT 45.8 12/20/2013   MCV 86.7 12/20/2013   PLT 244.0 12/20/2013   No results found for: "IRON", "TIBC", "FERRITIN"  Attestation Statements:   Reviewed by clinician on day of visit: allergies, medications, problem list, medical history, surgical history, family history, social history, and previous encounter notes.   Wilhemena Durie, am acting as transcriptionist for Loyal Gambler, DO.  I have reviewed the above documentation for accuracy and completeness, and I agree with the above. Dell Ponto, DO

## 2022-01-23 ENCOUNTER — Encounter (INDEPENDENT_AMBULATORY_CARE_PROVIDER_SITE_OTHER): Payer: Self-pay

## 2022-01-28 ENCOUNTER — Other Ambulatory Visit (HOSPITAL_BASED_OUTPATIENT_CLINIC_OR_DEPARTMENT_OTHER): Payer: Self-pay

## 2022-01-28 ENCOUNTER — Ambulatory Visit (INDEPENDENT_AMBULATORY_CARE_PROVIDER_SITE_OTHER): Payer: BC Managed Care – PPO | Admitting: Family Medicine

## 2022-01-28 ENCOUNTER — Encounter (INDEPENDENT_AMBULATORY_CARE_PROVIDER_SITE_OTHER): Payer: Self-pay | Admitting: Family Medicine

## 2022-01-28 VITALS — BP 125/80 | HR 80 | Ht 63.0 in | Wt 173.0 lb

## 2022-01-28 DIAGNOSIS — E669 Obesity, unspecified: Secondary | ICD-10-CM

## 2022-01-28 DIAGNOSIS — I10 Essential (primary) hypertension: Secondary | ICD-10-CM

## 2022-01-28 DIAGNOSIS — Z6830 Body mass index (BMI) 30.0-30.9, adult: Secondary | ICD-10-CM

## 2022-01-28 DIAGNOSIS — Z9884 Bariatric surgery status: Secondary | ICD-10-CM | POA: Insufficient documentation

## 2022-01-28 DIAGNOSIS — E673 Hypervitaminosis D: Secondary | ICD-10-CM

## 2022-01-28 MED ORDER — WEGOVY 2.4 MG/0.75ML ~~LOC~~ SOAJ
SUBCUTANEOUS | 0 refills | Status: DC
  Filled 2022-01-28: qty 3, 28d supply, fill #0
  Filled 2022-02-21: qty 3, 28d supply, fill #1

## 2022-02-08 NOTE — Progress Notes (Unsigned)
Chief Complaint:   OBESITY Brad Barrera is here to discuss his progress with his obesity treatment plan along with follow-up of his obesity related diagnoses. Brad Barrera is on keeping a food journal and adhering to recommended goals of 1700-1900 calories and 90-100 protein and states he is following his eating plan approximately 75% of the time. Brad Barrera states he is gym, weights, treadmill 60 minutes 3 times per week.  Today's visit was #: 2 Starting weight: 172 lbs Starting date: 01/12/2022 Today's weight: 173 lbs Today's date: 01/28/2022 Total lbs lost to date: 0 Total lbs lost since last in-office visit: +1 lb  Interim History: doing well with the dietary logging.  Getting in 1800 calories per day and increased protein to 90-100 grams per day.  Doing both cardio and resistance training at the gym 3 x per week.  Adequate satiety on Wegovy 2.4 mg weekly with out adverse side effects.   Subjective:   1. Hypervitaminosis D Vitamin D level 01/12/2022 was 109.  Stopped prescription Vitamin d 11/25/21, his level was 143.  2. Essential hypertension Has stopped losartan 50 mg for one week and blood pressures are at goal.    3. S/P laparoscopic sleeve gastrectomy Annual bariatric labs are up to date.  Continue a multivitamin daily, calcium citrate BID.  Does not need additional B-12.    Assessment/Plan:   1. Hypervitaminosis D Recheck vitamin D level in 3 months.   2. Essential hypertension Okay to stay off losartan and follow up with dr Paulina Fusi.   3. S/P laparoscopic sleeve gastrectomy Continue high protein, low carb post bariatric surgery diet.   4. Obesity,current BMI 30.6 Refill - Semaglutide-Weight Management (WEGOVY) 2.4 MG/0.75ML SOAJ; Inject 2.4 mg into the skin once a week.  Dispense: 9 mL; Refill: 0  Brad Barrera is currently in the action stage of change. As such, his goal is to continue with weight loss efforts. He has agreed to keeping a food journal and adhering to recommended  goals of 1800 calories and 90-100 protein daily.   Exercise goals: As is, but can add in tracking daily steps.   Behavioral modification strategies: increasing lean protein intake, increasing vegetables, increasing water intake, decreasing eating out, no skipping meals, better snacking choices, and decreasing junk food.  Brad Barrera has agreed to follow-up with our clinic in 4 weeks. He was informed of the importance of frequent follow-up visits to maximize his success with intensive lifestyle modifications for his multiple health conditions.   Objective:   Blood pressure 125/80, pulse 80, height 5\' 3"  (1.6 m), weight 173 lb (78.5 kg), SpO2 100 %. Body mass index is 30.65 kg/m.  General: Cooperative, alert, well developed, in no acute distress. HEENT: Conjunctivae and lids unremarkable. Cardiovascular: Regular rhythm.  Lungs: Normal work of breathing. Neurologic: No focal deficits.   Lab Results  Component Value Date   CREATININE 0.8 12/20/2013   BUN 14 12/20/2013   NA 137 12/20/2013   K 3.9 12/20/2013   CL 100 12/20/2013   CO2 28 12/20/2013   Lab Results  Component Value Date   ALT 29 12/20/2013   AST 27 12/20/2013   ALKPHOS 56 12/20/2013   BILITOT 1.2 12/20/2013   Lab Results  Component Value Date   HGBA1C 4.8 01/12/2022   HGBA1C 5.1 12/20/2013   HGBA1C 5.1 12/28/2008   Lab Results  Component Value Date   INSULIN 3.2 01/12/2022   Lab Results  Component Value Date   TSH 1.670 01/12/2022   Lab Results  Component Value Date   CHOL 144 01/12/2022   HDL 52 01/12/2022   LDLCALC 74 01/12/2022   TRIG 94 01/12/2022   CHOLHDL 4 12/20/2013   Lab Results  Component Value Date   VD25OH 109.0 (H) 01/12/2022   Lab Results  Component Value Date   WBC 6.0 12/20/2013   HGB 15.8 12/20/2013   HCT 45.8 12/20/2013   MCV 86.7 12/20/2013   PLT 244.0 12/20/2013   No results found for: "IRON", "TIBC", "FERRITIN"  Attestation Statements:   Reviewed by clinician on day of  visit: allergies, medications, problem list, medical history, surgical history, family history, social history, and previous encounter notes.  I, Malcolm Metro, am acting as Energy manager for Seymour Bars, DO.  I have reviewed the above documentation for accuracy and completeness, and I agree with the above. -  ***

## 2022-02-23 ENCOUNTER — Other Ambulatory Visit (HOSPITAL_BASED_OUTPATIENT_CLINIC_OR_DEPARTMENT_OTHER): Payer: Self-pay

## 2022-02-26 ENCOUNTER — Other Ambulatory Visit (HOSPITAL_BASED_OUTPATIENT_CLINIC_OR_DEPARTMENT_OTHER): Payer: Self-pay

## 2022-02-26 ENCOUNTER — Ambulatory Visit: Payer: BC Managed Care – PPO | Admitting: Nurse Practitioner

## 2022-02-26 ENCOUNTER — Encounter: Payer: Self-pay | Admitting: Nurse Practitioner

## 2022-02-26 VITALS — BP 121/82 | HR 68 | Temp 98.5°F | Ht 63.0 in | Wt 166.0 lb

## 2022-02-26 DIAGNOSIS — E669 Obesity, unspecified: Secondary | ICD-10-CM | POA: Diagnosis not present

## 2022-02-26 DIAGNOSIS — Z6829 Body mass index (BMI) 29.0-29.9, adult: Secondary | ICD-10-CM

## 2022-02-26 DIAGNOSIS — E673 Hypervitaminosis D: Secondary | ICD-10-CM | POA: Diagnosis not present

## 2022-02-26 MED ORDER — WEGOVY 2.4 MG/0.75ML ~~LOC~~ SOAJ
SUBCUTANEOUS | 0 refills | Status: DC
  Filled 2022-02-26: qty 9, fill #0
  Filled 2022-03-21: qty 3, 28d supply, fill #0
  Filled 2022-04-13: qty 3, 28d supply, fill #1

## 2022-03-02 NOTE — Progress Notes (Signed)
Chief Complaint:   OBESITY Brad Barrera is here to discuss his progress with his obesity treatment plan along with follow-up of his obesity related diagnoses. Brad Barrera is on keeping a food journal and adhering to recommended goals of 1800 calories and 90-100 grams of protein and states he is following his eating plan approximately 90-100% of the time. Brad Barrera states he is exercising 60 minutes 3 times per week.  Today's visit was #: 2 Starting weight: 172 lbs Starting date: 01/12/2022 Today's weight: 166 lbs Today's date: 02/26/2022 Total lbs lost to date: 6 lbs Total lbs lost since last in-office visit: 6  Interim History: Calories: 1800, Protein:90-100 grams. He is taking Wegovy 2.4. Denies any side effects. Brad Barrera is not skipping meals.  He denies polyphagia and or cravings. He is drinking 2 protein shakes and water daily. Working out with a physical therapist 2 days per week and going to the gym one additional day. Reports feeling great. Highest weight: 300 lbs. Taking calcium, 2 daily, MVI, biotin, and collagen.  Subjective:   1. Hypervitaminosis D Brad Barrera is taking multivitamin daily. Stopped taking Vit D after last labs. Denies any nausea, vomiting or muscle weakness.  Assessment/Plan:   1. Hypervitaminosis D Will continue to monitor. Plans to schedule a CPE in Nov. Will request Vit D to be rechecked.  2. Obesity,current BMI 29.4 We will refill Wegovy 2.4 mg once weekly for 3 months with 0 refills.Side effects discussed    -Refill Semaglutide-Weight Management (WEGOVY) 2.4 MG/0.75ML SOAJ; Inject 2.4 mg into the skin once a week.  Dispense: 9 mL; Refill: 0  Brad Barrera is currently in the action stage of change. As such, his goal is to continue with weight loss efforts. He has agreed to keeping a food journal and adhering to recommended goals of 1800 calories and 90-100 grams of protein.   Exercise goals: As is.  Behavioral modification strategies: increasing lean protein intake,  increasing vegetables, and increasing water intake.  Nil has agreed to follow-up with our clinic in 3 months. He was informed of the importance of frequent follow-up visits to maximize his success with intensive lifestyle modifications for his multiple health conditions.   Objective:   Blood pressure 121/82, pulse 68, temperature 98.5 F (36.9 C), temperature source Oral, height 5\' 3"  (1.6 m), weight 166 lb (75.3 kg), SpO2 99 %. Body mass index is 29.41 kg/m.  General: Cooperative, alert, well developed, in no acute distress. HEENT: Conjunctivae and lids unremarkable. Cardiovascular: Regular rhythm.  Lungs: Normal work of breathing. Neurologic: No focal deficits.   Lab Results  Component Value Date   CREATININE 0.8 12/20/2013   BUN 14 12/20/2013   NA 137 12/20/2013   K 3.9 12/20/2013   CL 100 12/20/2013   CO2 28 12/20/2013   Lab Results  Component Value Date   ALT 29 12/20/2013   AST 27 12/20/2013   ALKPHOS 56 12/20/2013   BILITOT 1.2 12/20/2013   Lab Results  Component Value Date   HGBA1C 4.8 01/12/2022   HGBA1C 5.1 12/20/2013   HGBA1C 5.1 12/28/2008   Lab Results  Component Value Date   INSULIN 3.2 01/12/2022   Lab Results  Component Value Date   TSH 1.670 01/12/2022   Lab Results  Component Value Date   CHOL 144 01/12/2022   HDL 52 01/12/2022   LDLCALC 74 01/12/2022   TRIG 94 01/12/2022   CHOLHDL 4 12/20/2013   Lab Results  Component Value Date   VD25OH 109.0 (H) 01/12/2022  Lab Results  Component Value Date   WBC 6.0 12/20/2013   HGB 15.8 12/20/2013   HCT 45.8 12/20/2013   MCV 86.7 12/20/2013   PLT 244.0 12/20/2013   No results found for: "IRON", "TIBC", "FERRITIN"  Attestation Statements:   Reviewed by clinician on day of visit: allergies, medications, problem list, medical history, surgical history, family history, social history, and previous encounter notes.  Time spent on visit including pre-visit chart review and post-visit care  and charting was 30 minutes.   I, Brendell Tyus, RMA, am acting as transcriptionist for Irene Limbo, FNP.  I have reviewed the above documentation for accuracy and completeness, and I agree with the above. Irene Limbo, FNP

## 2022-03-23 ENCOUNTER — Other Ambulatory Visit (HOSPITAL_BASED_OUTPATIENT_CLINIC_OR_DEPARTMENT_OTHER): Payer: Self-pay

## 2022-04-13 ENCOUNTER — Other Ambulatory Visit (HOSPITAL_BASED_OUTPATIENT_CLINIC_OR_DEPARTMENT_OTHER): Payer: Self-pay

## 2022-04-13 MED ORDER — FLUTICASONE PROPIONATE 50 MCG/ACT NA SUSP
2.0000 | Freq: Every day | NASAL | 11 refills | Status: DC
  Filled 2022-04-13 – 2022-04-15 (×3): qty 16, 30d supply, fill #0
  Filled 2022-05-17: qty 16, 30d supply, fill #1
  Filled 2022-06-15: qty 16, 30d supply, fill #2
  Filled 2022-08-01: qty 16, 30d supply, fill #3

## 2022-04-14 ENCOUNTER — Other Ambulatory Visit (HOSPITAL_BASED_OUTPATIENT_CLINIC_OR_DEPARTMENT_OTHER): Payer: Self-pay

## 2022-04-15 ENCOUNTER — Other Ambulatory Visit (HOSPITAL_BASED_OUTPATIENT_CLINIC_OR_DEPARTMENT_OTHER): Payer: Self-pay

## 2022-04-21 ENCOUNTER — Encounter: Payer: Self-pay | Admitting: Nurse Practitioner

## 2022-04-21 ENCOUNTER — Ambulatory Visit: Payer: BC Managed Care – PPO | Admitting: Nurse Practitioner

## 2022-04-21 ENCOUNTER — Telehealth: Payer: Self-pay

## 2022-04-21 ENCOUNTER — Other Ambulatory Visit (HOSPITAL_BASED_OUTPATIENT_CLINIC_OR_DEPARTMENT_OTHER): Payer: Self-pay

## 2022-04-21 VITALS — BP 127/85 | HR 84 | Temp 98.4°F | Ht 63.0 in | Wt 159.0 lb

## 2022-04-21 DIAGNOSIS — E669 Obesity, unspecified: Secondary | ICD-10-CM

## 2022-04-21 DIAGNOSIS — I10 Essential (primary) hypertension: Secondary | ICD-10-CM

## 2022-04-21 DIAGNOSIS — E65 Localized adiposity: Secondary | ICD-10-CM | POA: Diagnosis not present

## 2022-04-21 DIAGNOSIS — Z6828 Body mass index (BMI) 28.0-28.9, adult: Secondary | ICD-10-CM | POA: Diagnosis not present

## 2022-04-21 MED ORDER — WEGOVY 2.4 MG/0.75ML ~~LOC~~ SOAJ
SUBCUTANEOUS | 0 refills | Status: DC
  Filled 2022-04-21 – 2022-05-06 (×2): qty 3, 28d supply, fill #0
  Filled 2022-06-10: qty 3, 28d supply, fill #1

## 2022-04-21 NOTE — Patient Instructions (Signed)

## 2022-04-21 NOTE — Telephone Encounter (Signed)
Prior Auth for [facility] 2.4/0.75  PA has been resolved, no additional PA is required. For further inquiries please contact the number on the back of the member prescription card.

## 2022-04-22 NOTE — Telephone Encounter (Signed)
It doesn't need a PA, it should be covered.

## 2022-04-30 ENCOUNTER — Encounter: Payer: Self-pay | Admitting: Nurse Practitioner

## 2022-04-30 NOTE — Progress Notes (Signed)
Chief Complaint:   OBESITY Brad Barrera is here to discuss his progress with his obesity treatment plan along with follow-up of his obesity related diagnoses. Brad Barrera is on keeping a food journal and adhering to recommended goals of 1800 calories and 90-100 grams of protein and states he is following his eating plan approximately ?% of the time. Brad Barrera states he is going to the gym 30 minutes 3 times per week.  Today's visit was #: 3 Starting weight: 172 lbs Starting date: 01/12/2022 Today's weight: 159 lbs Today's date: 04/21/2022 Total lbs lost to date: 13 lbs Total lbs lost since last in-office visit: 7  Interim History: Brad Barrera has overall done well with weight loss. He is taking Wegovy 2.4 mg. Denies any side effects. Denies polyphagia or cravings. He is going to the beach for Thanksgiving and just returned from [place] this past weekend.   Subjective:   1. Pannus, abdominal Brad Barrera is struggling with irritation and redness since losing weight. He is using over the counter medication.  2. Essential hypertension Brad Barrera has been off his Losartan. His blood pressure 127/85 today. Denies any chest pain,shortness of breath or palpitations.  Assessment/Plan:   1. Pannus, abdominal Will continue to monitor.  2. Essential hypertension Will continue to monitor.  Brad Barrera has been working on healthy weight loss and exercise to improve blood pressure control. We will continue to monitor and watch his BP closely.     3. Obesity,current BMI 28.2 We will refill Wegovy 2.4 mg SQ once weekly for 1 month with 0 refills. Side effects discussed.   -Refill Semaglutide-Weight Management (WEGOVY) 2.4 MG/0.75ML SOAJ; Inject 2.4 mg into the skin once a week.  Dispense: 9 mL; Refill: 0  Brad Barrera is currently in the action stage of change. As such, his goal is to continue with weight loss efforts. He has agreed to keeping a food journal and adhering to recommended goals of 1800-1900 calories and 100 grams of  protein.   Exercise goals: As is.  Behavioral modification strategies: holiday eating strategies .  Brad Barrera has agreed to follow-up with our clinic in 8 weeks. He was informed of the importance of frequent follow-up visits to maximize his success with intensive lifestyle modifications for his multiple health conditions.   Objective:   Blood pressure 127/85, pulse 84, temperature 98.4 F (36.9 C), temperature source Oral, height 5\' 3"  (1.6 m), weight 159 lb (72.1 kg), SpO2 99 %. Body mass index is 28.17 kg/m.  General: Cooperative, alert, well developed, in no acute distress. HEENT: Conjunctivae and lids unremarkable. Cardiovascular: Regular rhythm.  Lungs: Normal work of breathing. Neurologic: No focal deficits.   Lab Results  Component Value Date   CREATININE 0.8 12/20/2013   BUN 14 12/20/2013   NA 137 12/20/2013   K 3.9 12/20/2013   CL 100 12/20/2013   CO2 28 12/20/2013   Lab Results  Component Value Date   ALT 29 12/20/2013   AST 27 12/20/2013   ALKPHOS 56 12/20/2013   BILITOT 1.2 12/20/2013   Lab Results  Component Value Date   HGBA1C 4.8 01/12/2022   HGBA1C 5.1 12/20/2013   HGBA1C 5.1 12/28/2008   Lab Results  Component Value Date   INSULIN 3.2 01/12/2022   Lab Results  Component Value Date   TSH 1.670 01/12/2022   Lab Results  Component Value Date   CHOL 144 01/12/2022   HDL 52 01/12/2022   LDLCALC 74 01/12/2022   TRIG 94 01/12/2022   CHOLHDL 4 12/20/2013  Lab Results  Component Value Date   VD25OH 109.0 (H) 01/12/2022   Lab Results  Component Value Date   WBC 6.0 12/20/2013   HGB 15.8 12/20/2013   HCT 45.8 12/20/2013   MCV 86.7 12/20/2013   PLT 244.0 12/20/2013   No results found for: "IRON", "TIBC", "FERRITIN"  Attestation Statements:   Reviewed by clinician on day of visit: allergies, medications, problem list, medical history, surgical history, family history, social history, and previous encounter notes.  I, Brendell Tyus, RMA,  am acting as transcriptionist for Irene Limbo, FNP.  I have reviewed the above documentation for accuracy and completeness, and I agree with the above. Irene Limbo, FNP

## 2022-05-06 ENCOUNTER — Other Ambulatory Visit (HOSPITAL_BASED_OUTPATIENT_CLINIC_OR_DEPARTMENT_OTHER): Payer: Self-pay

## 2022-05-18 ENCOUNTER — Other Ambulatory Visit (HOSPITAL_BASED_OUTPATIENT_CLINIC_OR_DEPARTMENT_OTHER): Payer: Self-pay

## 2022-05-21 ENCOUNTER — Other Ambulatory Visit (HOSPITAL_BASED_OUTPATIENT_CLINIC_OR_DEPARTMENT_OTHER): Payer: Self-pay

## 2022-06-15 ENCOUNTER — Encounter: Payer: Self-pay | Admitting: Nurse Practitioner

## 2022-06-15 ENCOUNTER — Ambulatory Visit: Payer: BC Managed Care – PPO | Admitting: Nurse Practitioner

## 2022-06-15 ENCOUNTER — Other Ambulatory Visit (HOSPITAL_BASED_OUTPATIENT_CLINIC_OR_DEPARTMENT_OTHER): Payer: Self-pay

## 2022-06-15 VITALS — BP 133/83 | HR 72 | Temp 98.1°F | Ht 63.0 in | Wt 164.0 lb

## 2022-06-15 DIAGNOSIS — E669 Obesity, unspecified: Secondary | ICD-10-CM | POA: Diagnosis not present

## 2022-06-15 DIAGNOSIS — I10 Essential (primary) hypertension: Secondary | ICD-10-CM | POA: Diagnosis not present

## 2022-06-15 DIAGNOSIS — Z6829 Body mass index (BMI) 29.0-29.9, adult: Secondary | ICD-10-CM | POA: Diagnosis not present

## 2022-06-15 DIAGNOSIS — Z6830 Body mass index (BMI) 30.0-30.9, adult: Secondary | ICD-10-CM

## 2022-06-15 MED ORDER — WEGOVY 2.4 MG/0.75ML ~~LOC~~ SOAJ
2.4000 mg | SUBCUTANEOUS | 0 refills | Status: DC
  Filled 2022-06-15: qty 9, fill #0
  Filled 2022-07-03 – 2022-07-06 (×2): qty 3, 28d supply, fill #0
  Filled 2022-07-29 – 2022-07-31 (×2): qty 3, 28d supply, fill #1
  Filled 2022-08-17: qty 3, 28d supply, fill #2

## 2022-06-16 ENCOUNTER — Ambulatory Visit: Payer: BC Managed Care – PPO | Admitting: Nurse Practitioner

## 2022-06-22 NOTE — Progress Notes (Signed)
Chief Complaint:   OBESITY Brad Barrera is here to discuss his progress with his obesity treatment plan along with follow-up of his obesity related diagnoses. Brad Barrera is on keeping a food journal and adhering to recommended goals of 1800-1900 calories and 100 grams of protein and states he is following his eating plan approximately 100% of the time. Brad Barrera states he is walking/using resistance bands 45/30 minutes 3-4 times per week.  Today's visit was #: 4 Starting weight: 172 lbs Starting date: 01/12/2022 Today's weight: 164 lbs Today's date: 06/15/2022 Total lbs lost to date: 8 lbs Total lbs lost since last in-office visit: 0  Interim History: Brad Barrera is taking Wegovy 2.4 mg.  Denies side effects.  Has overall done well with weight loss.  His highest weight was 300 lbs and his nadir weight is 150s-160s.  Has tried Qsymia, Phentermine and Saxenda for medical weight loss.  Lap band in 2011 and S/P SG with Dr. Evorn Gong 03/16/16.  Subjective:   1. Essential hypertension Brad Barrera's blood pressure slightly elevated today.  Denies any chest pain,shortness of breath or palpitations.  Assessment/Plan:   1. Essential hypertension Brad Barrera is working on healthy weight loss and exercise to improve blood pressure control. We will watch for signs of hypotension as he continues his lifestyle modifications.   2. Obesity,current BMI 29.1 We will refill Wegovy 2.4 mg SQ once weekly for 3 month supply with 0 refills.  Side effects discussed.   -Refill Semaglutide-Weight Management (WEGOVY) 2.4 MG/0.75ML SOAJ; Inject 2.4 mg into the skin once a week.  Dispense: 9 mL; Refill: 0  Brad Barrera is currently in the action stage of change. As such, his goal is to continue with weight loss efforts. He has agreed to keeping a food journal and adhering to recommended goals of 1400-1500 calories and 100+ grams of protein.   Exercise goals: As is.  Behavioral modification strategies: increasing lean protein intake,  increasing water intake, and no skipping meals.  Brad Barrera has agreed to follow-up with our clinic in 3 months. He was informed of the importance of frequent follow-up visits to maximize his success with intensive lifestyle modifications for his multiple health conditions.   Objective:   Blood pressure 133/83, pulse 72, temperature 98.1 F (36.7 C), height 5\' 3"  (1.6 m), weight 164 lb (74.4 kg), SpO2 99 %. Body mass index is 29.05 kg/m.  General: Cooperative, alert, well developed, in no acute distress. HEENT: Conjunctivae and lids unremarkable. Cardiovascular: Regular rhythm.  Lungs: Normal work of breathing. Neurologic: No focal deficits.   Lab Results  Component Value Date   CREATININE 0.8 12/20/2013   BUN 14 12/20/2013   NA 137 12/20/2013   K 3.9 12/20/2013   CL 100 12/20/2013   CO2 28 12/20/2013   Lab Results  Component Value Date   ALT 29 12/20/2013   AST 27 12/20/2013   ALKPHOS 56 12/20/2013   BILITOT 1.2 12/20/2013   Lab Results  Component Value Date   HGBA1C 4.8 01/12/2022   HGBA1C 5.1 12/20/2013   HGBA1C 5.1 12/28/2008   Lab Results  Component Value Date   INSULIN 3.2 01/12/2022   Lab Results  Component Value Date   TSH 1.670 01/12/2022   Lab Results  Component Value Date   CHOL 144 01/12/2022   HDL 52 01/12/2022   LDLCALC 74 01/12/2022   TRIG 94 01/12/2022   CHOLHDL 4 12/20/2013   Lab Results  Component Value Date   VD25OH 109.0 (H) 01/12/2022   Lab Results  Component Value Date   WBC 6.0 12/20/2013   HGB 15.8 12/20/2013   HCT 45.8 12/20/2013   MCV 86.7 12/20/2013   PLT 244.0 12/20/2013   No results found for: "IRON", "TIBC", "FERRITIN"  Attestation Statements:   Reviewed by clinician on day of visit: allergies, medications, problem list, medical history, surgical history, family history, social history, and previous encounter notes.  I, Brendell Tyus, RMA, am acting as transcriptionist for Everardo Pacific, FNP.  I have reviewed  the above documentation for accuracy and completeness, and I agree with the above. Everardo Pacific, FNP

## 2022-07-03 ENCOUNTER — Other Ambulatory Visit (HOSPITAL_BASED_OUTPATIENT_CLINIC_OR_DEPARTMENT_OTHER): Payer: Self-pay

## 2022-07-06 ENCOUNTER — Other Ambulatory Visit: Payer: Self-pay

## 2022-07-06 ENCOUNTER — Other Ambulatory Visit (HOSPITAL_BASED_OUTPATIENT_CLINIC_OR_DEPARTMENT_OTHER): Payer: Self-pay

## 2022-07-30 ENCOUNTER — Other Ambulatory Visit (HOSPITAL_BASED_OUTPATIENT_CLINIC_OR_DEPARTMENT_OTHER): Payer: Self-pay

## 2022-07-31 ENCOUNTER — Other Ambulatory Visit (HOSPITAL_BASED_OUTPATIENT_CLINIC_OR_DEPARTMENT_OTHER): Payer: Self-pay

## 2022-08-17 ENCOUNTER — Other Ambulatory Visit (HOSPITAL_BASED_OUTPATIENT_CLINIC_OR_DEPARTMENT_OTHER): Payer: Self-pay

## 2022-08-19 ENCOUNTER — Encounter: Payer: Self-pay | Admitting: Nurse Practitioner

## 2022-08-19 ENCOUNTER — Ambulatory Visit: Payer: BC Managed Care – PPO | Admitting: Nurse Practitioner

## 2022-08-19 ENCOUNTER — Other Ambulatory Visit (HOSPITAL_BASED_OUTPATIENT_CLINIC_OR_DEPARTMENT_OTHER): Payer: Self-pay

## 2022-08-19 VITALS — BP 135/88 | HR 77 | Temp 98.2°F | Ht 63.0 in | Wt 165.0 lb

## 2022-08-19 DIAGNOSIS — E66811 Obesity, class 1: Secondary | ICD-10-CM

## 2022-08-19 DIAGNOSIS — E669 Obesity, unspecified: Secondary | ICD-10-CM

## 2022-08-19 DIAGNOSIS — J45909 Unspecified asthma, uncomplicated: Secondary | ICD-10-CM

## 2022-08-19 DIAGNOSIS — Z6829 Body mass index (BMI) 29.0-29.9, adult: Secondary | ICD-10-CM | POA: Diagnosis not present

## 2022-08-19 MED ORDER — WEGOVY 2.4 MG/0.75ML ~~LOC~~ SOAJ
2.4000 mg | SUBCUTANEOUS | 0 refills | Status: DC
Start: 2022-08-19 — End: 2022-11-09
  Filled 2022-08-19 – 2022-08-25 (×2): qty 3, 28d supply, fill #0
  Filled 2022-09-23: qty 3, 28d supply, fill #1

## 2022-08-19 NOTE — Progress Notes (Signed)
Office: [phone]  /  Fax: [phone]  WEIGHT SUMMARY AND BIOMETRICS  No data recorded Weight Gained Since Last Visit: 1lb   Vitals Temp: 98.2 F (36.8 C) BP: 135/88 Pulse Rate: 77 SpO2: 97 %   Anthropometric Measurements Height: 5\' 3"  (1.6 m) Weight: 165 lb (74.8 kg) BMI (Calculated): 29.24 Weight at Last Visit: 164lb Weight Gained Since Last Visit: 1lb Starting Weight: 172lb Total Weight Loss (lbs): 6 lb (2.722 kg)   Body Composition  Body Fat %: 23.7 % Fat Mass (lbs): 39.2 lbs Muscle Mass (lbs): 120 lbs Total Body Water (lbs): 87 lbs Visceral Fat Rating : 12   Other Clinical Data Today's Visit #: 5 Starting Date: 01/12/22     HPI  Chief Complaint: OBESITY  Achintya is here to discuss his progress with his obesity treatment plan. He is on the keeping a food journal and adhering to recommended goals of 1700-1800 calories and 100+ protein and states he is following his eating plan approximately 100 % of the time. He states he is exercising 30 minutes 3 days per week.   Interval History:  Since last office visit he gained 1 pound.  Highest weight: 300 lbs. Taking calcium, 2 daily, MVI, biotin, and collagen. Doing well and no complaints.  His insurance is no longer going to cover [facility] after April 1st.  Has questions today about other treatment options.     Pharmacotherapy for weight loss: He is currently taking Wegovy 2.4mg  for medical weight loss.  Denies side effects.    Previously tried Qsymia, phentermine, and Saxenda for medical weight loss.   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  His highest weight prior to surgery was 300 lbs and his nadir weight after surgery was 159 lbs.      PHYSICAL EXAM:  Blood pressure 135/88, pulse 77, temperature 98.2 F (36.8 C), height 5\' 3"  (1.6 m), weight 165 lb (74.8 kg), SpO2 97 %. Body mass index is 29.23 kg/m.  General: He is overweight, cooperative,  alert, well developed, and in no acute distress. PSYCH: Has normal mood, affect and thought process.   Extremities: No edema.  Neurologic: No gross sensory or motor deficits. No tremors or fasciculations noted.    DIAGNOSTIC DATA REVIEWED:  BMET    Component Value Date/Time   NA 137 12/20/2013 0839   K 3.9 12/20/2013 0839   CL 100 12/20/2013 0839   CO2 28 12/20/2013 0839   GLUCOSE 87 12/20/2013 0839   BUN 14 12/20/2013 0839   CREATININE 0.8 12/20/2013 0839   CALCIUM 9.4 12/20/2013 0839   GFRNONAA >90 11/08/2011 1014   GFRAA >90 11/08/2011 1014   Lab Results  Component Value Date   HGBA1C 4.8 01/12/2022   HGBA1C 5.1 12/28/2008   Lab Results  Component Value Date   INSULIN 3.2 01/12/2022   Lab Results  Component Value Date   TSH 1.670 01/12/2022   CBC    Component Value Date/Time   WBC 6.0 12/20/2013 0839   RBC 5.29 12/20/2013 0839   HGB 15.8 12/20/2013 0839   HCT 45.8 12/20/2013 0839   PLT 244.0 12/20/2013 0839   MCV 86.7 12/20/2013 0839   MCH 30.1 11/08/2011 1014   MCHC 34.5 12/20/2013 0839   RDW 13.6 12/20/2013 0839   Iron Studies No results found for: "IRON", "TIBC", "FERRITIN", "IRONPCTSAT" Lipid Panel     Component Value Date/Time   CHOL 144 01/12/2022 1035   TRIG  94 01/12/2022 1035   HDL 52 01/12/2022 1035   CHOLHDL 4 12/20/2013 0839   VLDL 31.6 12/20/2013 0839   LDLCALC 74 01/12/2022 1035   Hepatic Function Panel     Component Value Date/Time   PROT 7.6 12/20/2013 0839   ALBUMIN 4.3 12/20/2013 0839   AST 27 12/20/2013 0839   ALT 29 12/20/2013 0839   ALKPHOS 56 12/20/2013 0839   BILITOT 1.2 12/20/2013 0839   BILIDIR 0.2 12/20/2013 0839      Component Value Date/Time   TSH 1.670 01/12/2022 1035   Nutritional Lab Results  Component Value Date   VD25OH 109.0 (H) 01/12/2022     ASSESSMENT AND PLAN  TREATMENT PLAN FOR OBESITY:  Recommended Dietary Goals  Tashun is currently in the action stage of change. As such, his goal is to  continue weight management plan. He has agreed to keeping a food journal and adhering to recommended goals of 1700-1900 calories and 90+ protein.  Behavioral Intervention  We discussed the following Behavioral Modification Strategies today: increasing lean protein intake, decreasing simple carbohydrates , increasing vegetables, avoiding skipping meals, increasing water intake, and work on meal planning and easy cooking plans.  Additional resources provided today: NA  Recommended Physical Activity Goals  Gatlyn has been advised to work up to 150 minutes of moderate intensity aerobic activity a week and strengthening exercises 2-3 times per week for cardiovascular health, weight loss maintenance and preservation of muscle mass.   He has agreed to Continue current level of physical activity    Pharmacotherapy We discussed various medication options to help Lyn with his weight loss efforts and we both agreed to continue [facility] 2.4mg .  Information given on Qsymia and Contrave. Would use Qsymia with caution due to patient's history of kidney stones. Contrave would be a better option due to his history of kidney stones.     ASSOCIATED CONDITIONS ADDRESSED TODAY  Action/Plan  Asthma due to environmental allergies Continue OTC medications PRN-Flonase, Claritin.     Generalized obesity  Obesity,current BMI 29.2 -     Wegovy; Inject 2.4 mg into the skin once a week.  Dispense: 9 mL; Refill: 0       He is scheduled for a CPE in April    Return in about 3 months (around 11/19/2022).Marland Kitchen He was informed of the importance of frequent follow up visits to maximize his success with intensive lifestyle modifications for his multiple health conditions.   ATTESTASTION STATEMENTS:  Reviewed by clinician on day of visit: allergies, medications, problem list, medical history, surgical history, family history, social history, and previous encounter notes.     Ailene Rud. Mykai Wendorf FNP-C

## 2022-08-19 NOTE — Patient Instructions (Signed)

## 2022-08-25 ENCOUNTER — Other Ambulatory Visit (HOSPITAL_BASED_OUTPATIENT_CLINIC_OR_DEPARTMENT_OTHER): Payer: Self-pay

## 2022-09-23 ENCOUNTER — Other Ambulatory Visit: Payer: Self-pay | Admitting: Nurse Practitioner

## 2022-09-23 ENCOUNTER — Other Ambulatory Visit (HOSPITAL_BASED_OUTPATIENT_CLINIC_OR_DEPARTMENT_OTHER): Payer: Self-pay

## 2022-09-23 ENCOUNTER — Encounter: Payer: Self-pay | Admitting: Nurse Practitioner

## 2022-09-23 MED ORDER — CONTRAVE 8-90 MG PO TB12: ORAL_TABLET | ORAL | 0 refills | Status: DC

## 2022-10-20 ENCOUNTER — Other Ambulatory Visit: Payer: Self-pay | Admitting: Nurse Practitioner

## 2022-10-21 ENCOUNTER — Other Ambulatory Visit: Payer: Self-pay | Admitting: Nurse Practitioner

## 2022-10-21 MED ORDER — CONTRAVE 8-90 MG PO TB12: ORAL_TABLET | ORAL | 0 refills | Status: DC

## 2022-10-22 ENCOUNTER — Encounter: Payer: Self-pay | Admitting: Nurse Practitioner

## 2022-11-09 ENCOUNTER — Encounter: Payer: Self-pay | Admitting: Nurse Practitioner

## 2022-11-09 ENCOUNTER — Ambulatory Visit: Payer: BC Managed Care – PPO | Admitting: Nurse Practitioner

## 2022-11-09 VITALS — BP 144/82 | HR 72 | Temp 98.2°F | Ht 63.0 in | Wt 173.0 lb

## 2022-11-09 DIAGNOSIS — E669 Obesity, unspecified: Secondary | ICD-10-CM | POA: Diagnosis not present

## 2022-11-09 DIAGNOSIS — Z6830 Body mass index (BMI) 30.0-30.9, adult: Secondary | ICD-10-CM

## 2022-11-09 DIAGNOSIS — I10 Essential (primary) hypertension: Secondary | ICD-10-CM | POA: Diagnosis not present

## 2022-11-09 MED ORDER — CONTRAVE 8-90 MG PO TB12: ORAL_TABLET | ORAL | 0 refills | Status: DC

## 2022-11-09 NOTE — Progress Notes (Signed)
Office: [phone]  /  Fax: [phone]  WEIGHT SUMMARY AND BIOMETRICS  No data recorded Weight Gained Since Last Visit: 8lb   Vitals Temp: 98.2 F (36.8 C) BP: (!) 144/82 Pulse Rate: 72 SpO2: 97 %   Anthropometric Measurements Height: 5\' 3"  (1.6 m) Weight: 173 lb (78.5 kg) BMI (Calculated): 30.65 Weight at Last Visit: 165lb Weight Gained Since Last Visit: 8lb Starting Weight: 172lb Total Weight Loss (lbs): 0 lb (0 kg)   Body Composition  Body Fat %: 28 % Fat Mass (lbs): 48.4 lbs Muscle Mass (lbs): 118.4 lbs Total Body Water (lbs): 89.4 lbs Visceral Fat Rating : 14   Other Clinical Data Fasting: No Labs: No Today's Visit #: 6 Starting Date: 01/12/22     HPI  Chief Complaint: OBESITY  Brad Barrera is here to discuss his progress with his obesity treatment plan. He is on the keeping a food journal and adhering to recommended goals of 1800-2000 calories and 100+ protein and states he is following his eating plan approximately 100 % of the time. He states he is exercising 0 minutes 0 days per week.   Interval History:  Since last office visit he has gained 8 pounds. He started tracking today.  He has been stress eating.  He is starting a new program at school today.  He is aiming to eat more protein now.  He is drinking water, coffee and unsweetened tea.  Denies sugary drinks    Pharmacotherapy for weight loss: He is currently taking Contrave 2 am and 1 po for medical weight loss.  He is planning to increase to 2 po BID today.  Denies side effects.    Previous pharmacotherapy for medical weight loss:  Previously tried Qsymia, phentermine, Wegovy and Saxenda for medical weight loss.   Bariatric surgery:  Patient is status post Sleeve gastrectomy by Dr.  Adolphus Birchwood in Oct 2017.  His highest weight prior to surgery was 300 lbs and his nadir weight after surgery was 159 lbs.    Hypertension Hypertension BP is elevated today.  He feels due to stress of starting a new  program at school.  Medication(s): none Denies chest pain, palpitations and SOB.  BP Readings from Last 3 Encounters:  11/09/22 (!) 144/82  08/19/22 135/88  06/15/22 133/83   Lab Results  Component Value Date   CREATININE 0.8 12/20/2013   CREATININE 0.8 12/08/2012   CREATININE 0.67 11/08/2011      PHYSICAL EXAM:  Blood pressure (!) 144/82, pulse 72, temperature 98.2 F (36.8 C), height 5\' 3"  (1.6 m), weight 173 lb (78.5 kg), SpO2 97 %. Body mass index is 30.65 kg/m.  General: He is overweight, cooperative, alert, well developed, and in no acute distress. PSYCH: Has normal mood, affect and thought process.   Extremities: No edema.  Neurologic: No gross sensory or motor deficits. No tremors or fasciculations noted.    DIAGNOSTIC DATA REVIEWED:  BMET    Component Value Date/Time   NA 137 12/20/2013 0839   K 3.9 12/20/2013 0839   CL 100 12/20/2013 0839   CO2 28 12/20/2013 0839   GLUCOSE 87 12/20/2013 0839   BUN 14 12/20/2013 0839   CREATININE 0.8 12/20/2013 0839   CALCIUM 9.4 12/20/2013 0839   GFRNONAA >90 11/08/2011 1014   GFRAA >90 11/08/2011 1014   Lab Results  Component Value Date   HGBA1C 4.8 01/12/2022   HGBA1C 5.1 12/28/2008   Lab Results  Component Value Date   INSULIN 3.2 01/12/2022   Lab  Results  Component Value Date   TSH 1.670 01/12/2022   CBC    Component Value Date/Time   WBC 6.0 12/20/2013 0839   RBC 5.29 12/20/2013 0839   HGB 15.8 12/20/2013 0839   HCT 45.8 12/20/2013 0839   PLT 244.0 12/20/2013 0839   MCV 86.7 12/20/2013 0839   MCH 30.1 11/08/2011 1014   MCHC 34.5 12/20/2013 0839   RDW 13.6 12/20/2013 0839   Iron Studies No results found for: "IRON", "TIBC", "FERRITIN", "IRONPCTSAT" Lipid Panel     Component Value Date/Time   CHOL 144 01/12/2022 1035   TRIG 94 01/12/2022 1035   HDL 52 01/12/2022 1035   CHOLHDL 4 12/20/2013 0839   VLDL 31.6 12/20/2013 0839   LDLCALC 74 01/12/2022 1035   Hepatic Function Panel      Component Value Date/Time   PROT 7.6 12/20/2013 0839   ALBUMIN 4.3 12/20/2013 0839   AST 27 12/20/2013 0839   ALT 29 12/20/2013 0839   ALKPHOS 56 12/20/2013 0839   BILITOT 1.2 12/20/2013 0839   BILIDIR 0.2 12/20/2013 0839      Component Value Date/Time   TSH 1.670 01/12/2022 1035   Nutritional Lab Results  Component Value Date   VD25OH 109.0 (H) 01/12/2022     ASSESSMENT AND PLAN  TREATMENT PLAN FOR OBESITY:  Recommended Dietary Goals  Brad Barrera is currently in the action stage of change. As such, his goal is to continue weight management plan. He has agreed to keeping a food journal and adhering to recommended goals of 1200-1300 calories and 75+ protein.  Behavioral Intervention  We discussed the following Behavioral Modification Strategies today: increasing lean protein intake, decreasing simple carbohydrates , increasing vegetables, increasing lower glycemic fruits, increasing fiber rich foods, avoiding skipping meals, increasing water intake, emotional eating strategies and understanding the difference between hunger signals and cravings, work on managing stress, creating time for self-care and relaxation measures, continue to practice mindfulness when eating, and planning for success.  Additional resources provided today: NA  Recommended Physical Activity Goals  Brad Barrera has been advised to work up to 150 minutes of moderate intensity aerobic activity a week and strengthening exercises 2-3 times per week for cardiovascular health, weight loss maintenance and preservation of muscle mass.   He has agreed to Think about ways to increase daily physical activity and overcoming barriers to exercise   Pharmacotherapy We discussed various medication options to help Brad Barrera with his weight loss efforts and we both agreed to continue Contrave.  Would use Qsymia with caution due to patient's history of kidney stones. Reginal Lutes would be the best choice for him due to his history of HTN,  OSAS, HLD but he is unable to continue due to cost    ASSOCIATED CONDITIONS ADDRESSED TODAY  Action/Plan  Essential hypertension Will continue to monitor.    Generalized obesity -     Contrave; take 2 po BID  Dispense: 120 tablet; Refill: 0.  Side effects discussed.  Due to increase to 2 po BID today.    BMI 30.0-30.9,adult -     Contrave; take 2 po BID  Dispense: 120 tablet; Refill: 0      Labs reviewed in chart with patient from 10/28/22   Return in about 3 months (around 02/09/2023).Marland Kitchen He was informed of the importance of frequent follow up visits to maximize his success with intensive lifestyle modifications for his multiple health conditions.   ATTESTASTION STATEMENTS:  Reviewed by clinician on day of visit: allergies, medications, problem list, medical  history, surgical history, family history, social history, and previous encounter notes.     Theodis Sato. Alianys Chacko FNP-C

## 2022-11-24 ENCOUNTER — Other Ambulatory Visit (HOSPITAL_BASED_OUTPATIENT_CLINIC_OR_DEPARTMENT_OTHER): Payer: Self-pay

## 2022-11-24 MED ORDER — ALBUTEROL SULFATE HFA 108 (90 BASE) MCG/ACT IN AERS
2.0000 | INHALATION_SPRAY | Freq: Four times a day (QID) | RESPIRATORY_TRACT | 0 refills | Status: DC | PRN
  Filled 2022-11-24: qty 6.7, 21d supply, fill #0
  Filled 2023-01-19: qty 6.7, 28d supply, fill #0

## 2022-11-24 MED ORDER — PAXLOVID (300/100) 20 X 150 MG & 10 X 100MG PO TBPK: ORAL_TABLET | ORAL | 0 refills | Status: DC

## 2022-11-26 ENCOUNTER — Encounter: Payer: Self-pay | Admitting: Nurse Practitioner

## 2022-12-14 ENCOUNTER — Other Ambulatory Visit: Payer: Self-pay | Admitting: Nurse Practitioner

## 2022-12-14 DIAGNOSIS — Z6830 Body mass index (BMI) 30.0-30.9, adult: Secondary | ICD-10-CM

## 2022-12-14 DIAGNOSIS — E669 Obesity, unspecified: Secondary | ICD-10-CM

## 2022-12-16 ENCOUNTER — Ambulatory Visit: Payer: BC Managed Care – PPO | Admitting: Nurse Practitioner

## 2022-12-16 ENCOUNTER — Encounter: Payer: Self-pay | Admitting: Nurse Practitioner

## 2022-12-16 VITALS — BP 147/90 | HR 68 | Temp 98.2°F | Ht 63.0 in | Wt 178.0 lb

## 2022-12-16 DIAGNOSIS — I10 Essential (primary) hypertension: Secondary | ICD-10-CM

## 2022-12-16 DIAGNOSIS — Z6831 Body mass index (BMI) 31.0-31.9, adult: Secondary | ICD-10-CM

## 2022-12-16 DIAGNOSIS — E669 Obesity, unspecified: Secondary | ICD-10-CM | POA: Diagnosis not present

## 2022-12-16 NOTE — Progress Notes (Signed)
Office: [phone]  /  Fax: [phone]  WEIGHT SUMMARY AND BIOMETRICS  Weight Lost Since Last Visit: 0lb  Weight Gained Since Last Visit: 5lb   Vitals Temp: 98.2 F (36.8 C) BP: (!) 147/90 Pulse Rate: 68 SpO2: 96 %   Anthropometric Measurements Height: 5\' 3"  (1.6 m) Weight: 178 lb (80.7 kg) BMI (Calculated): 31.54 Weight at Last Visit: 173lb Weight Lost Since Last Visit: 0lb Weight Gained Since Last Visit: 5lb Starting Weight: 172lb Total Weight Loss (lbs): 0 lb (0 kg)   Body Composition  Body Fat %: 28.6 % Fat Mass (lbs): 51 lbs Muscle Mass (lbs): 121 lbs Total Body Water (lbs): 91.2 lbs Visceral Fat Rating : 15   Other Clinical Data Fasting: Yes Labs: No Today's Visit #: 7 Starting Date: 01/12/22     HPI  Chief Complaint: OBESITY  Brad Barrera is here to discuss his progress with his obesity treatment plan. He is on the keeping a food journal and adhering to recommended goals of 1800-2000 calories and 100+ protein and states he is following his eating plan approximately 95 % of the time. He states he is exercising 30 minutes 3 days per week.   Interval History:  Since last office visit he has gained 5 pounds.  He comes in today with concerns of side effects to Contrave and would like to discuss additional options.     Pharmacotherapy for weight loss: He is currently taking Contrave 2 po BID for medical weight loss.  Reports side effects of mood change.   Previous pharmacotherapy for medical weight loss:  Previously tried Qsymia, phentermine, Wegovy and Saxenda for medical weight loss.   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 His highest weight prior to surgery was 300 lbs and his nadir weight after surgery was 159 lbs.   Hypertension Hypertension: BP is elevated today.  Medication(s): none Denies chest pain, palpitations and SOB.  BP Readings from Last 3 Encounters:  12/16/22 (!) 147/90   11/09/22 (!) 144/82  08/19/22 135/88   Lab Results  Component Value Date   CREATININE 0.8 12/20/2013   CREATININE 0.8 12/08/2012   CREATININE 0.67 11/08/2011      PHYSICAL EXAM:  Blood pressure (!) 147/90, pulse 68, temperature 98.2 F (36.8 C), height 5\' 3"  (1.6 m), weight 178 lb (80.7 kg), SpO2 96%. Body mass index is 31.53 kg/m.  General: He is overweight, cooperative, alert, well developed, and in no acute distress. PSYCH: Has normal mood, affect and thought process.   Extremities: No edema.  Neurologic: No gross sensory or motor deficits. No tremors or fasciculations noted.    DIAGNOSTIC DATA REVIEWED:  BMET    Component Value Date/Time   NA 137 12/20/2013 0839   K 3.9 12/20/2013 0839   CL 100 12/20/2013 0839   CO2 28 12/20/2013 0839   GLUCOSE 87 12/20/2013 0839   BUN 14 12/20/2013 0839   CREATININE 0.8 12/20/2013 0839   CALCIUM 9.4 12/20/2013 0839   GFRNONAA >90 11/08/2011 1014   GFRAA >90 11/08/2011 1014   Lab Results  Component Value Date   HGBA1C 4.8 01/12/2022   HGBA1C 5.1 12/28/2008   Lab Results  Component Value Date   INSULIN 3.2 01/12/2022   Lab Results  Component Value Date   TSH 1.670 01/12/2022   CBC    Component Value Date/Time   WBC 6.0 12/20/2013 0839   RBC 5.29 12/20/2013 0839   HGB 15.8 12/20/2013 0839  HCT 45.8 12/20/2013 0839   PLT 244.0 12/20/2013 0839   MCV 86.7 12/20/2013 0839   MCH 30.1 11/08/2011 1014   MCHC 34.5 12/20/2013 0839   RDW 13.6 12/20/2013 0839   Iron Studies No results found for: "IRON", "TIBC", "FERRITIN", "IRONPCTSAT" Lipid Panel     Component Value Date/Time   CHOL 144 01/12/2022 1035   TRIG 94 01/12/2022 1035   HDL 52 01/12/2022 1035   CHOLHDL 4 12/20/2013 0839   VLDL 31.6 12/20/2013 0839   LDLCALC 74 01/12/2022 1035   Hepatic Function Panel     Component Value Date/Time   PROT 7.6 12/20/2013 0839   ALBUMIN 4.3 12/20/2013 0839   AST 27 12/20/2013 0839   ALT 29 12/20/2013 0839   ALKPHOS  56 12/20/2013 0839   BILITOT 1.2 12/20/2013 0839   BILIDIR 0.2 12/20/2013 0839      Component Value Date/Time   TSH 1.670 01/12/2022 1035   Nutritional Lab Results  Component Value Date   VD25OH 109.0 (H) 01/12/2022     ASSESSMENT AND PLAN  TREATMENT PLAN FOR OBESITY:  Recommended Dietary Goals  Brad Barrera is currently in the action stage of change. As such, his goal is to continue weight management plan. He has agreed to keeping a food journal and adhering to recommended goals of 1600 calories and 90+ protein.  Behavioral Intervention  We discussed the following Behavioral Modification Strategies today: increasing lean protein intake, decreasing simple carbohydrates , increasing vegetables, increasing lower glycemic fruits, increasing water intake, continue to practice mindfulness when eating, and planning for success.  Additional resources provided today: NA  Recommended Physical Activity Goals  Brad Barrera has been advised to work up to 150 minutes of moderate intensity aerobic activity a week and strengthening exercises 2-3 times per week for cardiovascular health, weight loss maintenance and preservation of muscle mass.   He has agreed to Continue current level of physical activity  and Think about ways to increase daily physical activity and overcoming barriers to exercise   Pharmacotherapy We discussed various medication options to help Brad Barrera with his weight loss efforts and we both agreed to consider his options and to reach out to me via mychart.  Avoid Phentermine and Qsymia due to cardiac history/HTN  ASSOCIATED CONDITIONS ADDRESSED TODAY  Action/Plan  Essential hypertension Maybe elevated due to Contrave.  Plans to stop Contrave and would like to consider his options.  He is going on vacation and I've asked him to reach out to me when he gets home of how he would like to proceed with treatment.  His insurance does not cover any weight loss medications so cost would  be out of pocket.    Generalized obesity  BMI 31.0-31.9,adult      Options discussed today:  Zepbound Wegovy Ozempic (off label for weight loss) Rybelsus    Return in about 2 months (around 02/16/2023).Marland Kitchen He was informed of the importance of frequent follow up visits to maximize his success with intensive lifestyle modifications for his multiple health conditions.   ATTESTASTION STATEMENTS:  Reviewed by clinician on day of visit: allergies, medications, problem list, medical history, surgical history, family history, social history, and previous encounter notes.   Time spent on visit including pre-visit chart review and post-visit care and charting was 30 minutes.    Theodis Sato. Terree Gaultney FNP-C

## 2022-12-16 NOTE — Patient Instructions (Signed)

## 2022-12-21 ENCOUNTER — Other Ambulatory Visit (HOSPITAL_BASED_OUTPATIENT_CLINIC_OR_DEPARTMENT_OTHER): Payer: Self-pay

## 2022-12-21 MED ORDER — FLUTICASONE PROPIONATE 50 MCG/ACT NA SUSP
2.0000 | Freq: Every day | NASAL | 11 refills | Status: DC
  Filled 2022-12-21: qty 16, 30d supply, fill #0

## 2022-12-28 ENCOUNTER — Encounter: Payer: Self-pay | Admitting: Nurse Practitioner

## 2022-12-28 NOTE — Telephone Encounter (Signed)
Spoke with patient, he wants to get the Rybelsus (mail order)

## 2022-12-28 NOTE — Telephone Encounter (Signed)
Please advise 

## 2022-12-29 ENCOUNTER — Other Ambulatory Visit: Payer: Self-pay | Admitting: Nurse Practitioner

## 2022-12-29 ENCOUNTER — Other Ambulatory Visit (HOSPITAL_BASED_OUTPATIENT_CLINIC_OR_DEPARTMENT_OTHER): Payer: Self-pay

## 2022-12-29 DIAGNOSIS — E669 Obesity, unspecified: Secondary | ICD-10-CM

## 2022-12-29 MED ORDER — LIRAGLUTIDE 18 MG/3ML ~~LOC~~ SOPN: PEN_INJECTOR | SUBCUTANEOUS | 0 refills | Status: DC

## 2023-01-19 ENCOUNTER — Other Ambulatory Visit (HOSPITAL_BASED_OUTPATIENT_CLINIC_OR_DEPARTMENT_OTHER): Payer: Self-pay

## 2023-01-27 ENCOUNTER — Other Ambulatory Visit: Payer: Self-pay | Admitting: Nurse Practitioner

## 2023-01-27 DIAGNOSIS — E669 Obesity, unspecified: Secondary | ICD-10-CM

## 2023-02-01 ENCOUNTER — Other Ambulatory Visit: Payer: Self-pay | Admitting: Nurse Practitioner

## 2023-02-01 DIAGNOSIS — E669 Obesity, unspecified: Secondary | ICD-10-CM

## 2023-02-02 ENCOUNTER — Encounter: Payer: Self-pay | Admitting: Nurse Practitioner

## 2023-02-02 ENCOUNTER — Other Ambulatory Visit: Payer: Self-pay | Admitting: Nurse Practitioner

## 2023-02-02 DIAGNOSIS — E669 Obesity, unspecified: Secondary | ICD-10-CM

## 2023-02-02 MED ORDER — LIRAGLUTIDE 18 MG/3ML ~~LOC~~ SOPN: PEN_INJECTOR | SUBCUTANEOUS | 0 refills | Status: DC

## 2023-02-08 ENCOUNTER — Other Ambulatory Visit (HOSPITAL_BASED_OUTPATIENT_CLINIC_OR_DEPARTMENT_OTHER): Payer: Self-pay

## 2023-02-08 MED ORDER — ALBUTEROL SULFATE HFA 108 (90 BASE) MCG/ACT IN AERS
2.0000 | INHALATION_SPRAY | Freq: Four times a day (QID) | RESPIRATORY_TRACT | 0 refills | Status: AC | PRN
  Filled 2023-02-08 – 2023-02-09 (×2): qty 6.7, 28d supply, fill #0

## 2023-02-08 MED ORDER — FLUTICASONE PROPIONATE 50 MCG/ACT NA SUSP
2.0000 | Freq: Every day | NASAL | 11 refills | Status: DC
  Filled 2023-02-08: qty 16, 30d supply, fill #0
  Filled 2023-04-22: qty 16, 30d supply, fill #1

## 2023-02-09 ENCOUNTER — Encounter: Payer: Self-pay | Admitting: Nurse Practitioner

## 2023-02-09 ENCOUNTER — Ambulatory Visit: Payer: BC Managed Care – PPO | Admitting: Nurse Practitioner

## 2023-02-09 ENCOUNTER — Other Ambulatory Visit (HOSPITAL_BASED_OUTPATIENT_CLINIC_OR_DEPARTMENT_OTHER): Payer: Self-pay

## 2023-02-09 VITALS — BP 127/85 | HR 66 | Temp 98.1°F | Ht 63.0 in | Wt 182.0 lb

## 2023-02-09 DIAGNOSIS — Z6832 Body mass index (BMI) 32.0-32.9, adult: Secondary | ICD-10-CM | POA: Diagnosis not present

## 2023-02-09 DIAGNOSIS — E669 Obesity, unspecified: Secondary | ICD-10-CM | POA: Diagnosis not present

## 2023-02-09 DIAGNOSIS — R632 Polyphagia: Secondary | ICD-10-CM | POA: Diagnosis not present

## 2023-02-09 MED ORDER — LOMAIRA 8 MG PO TABS: 1.0000 | ORAL_TABLET | Freq: Every day | ORAL | 0 refills | Status: DC

## 2023-02-09 NOTE — Progress Notes (Signed)
Office: [phone]  /  Fax: [phone]  WEIGHT SUMMARY AND BIOMETRICS  Weight Lost Since Last Visit: 0  Weight Gained Since Last Visit: 4 lb   Vitals Temp: 98.1 F (36.7 C) BP: 127/85 Pulse Rate: 66 SpO2: 97 %   Anthropometric Measurements Height: 5\' 3"  (1.6 m) Weight: 182 lb (82.6 kg) BMI (Calculated): 32.25 Weight at Last Visit: 178 lb Weight Lost Since Last Visit: 0 Weight Gained Since Last Visit: 4 lb Starting Weight: 172 lb Total Weight Loss (lbs): 0 lb (0 kg) Peak Weight: 300 lb   Body Composition  Body Fat %: 25 % Fat Mass (lbs): 45.8 lbs Muscle Mass (lbs): 130.2 lbs Total Body Water (lbs): 91 lbs Visceral Fat Rating : 14   Other Clinical Data Fasting: yes Labs: no Today's Visit #: 9 Starting Date: 01/12/22     HPI  Chief Complaint: OBESITY  Brad Barrera is here to discuss his progress with his obesity treatment plan. He is on the keeping a food journal and adhering to recommended goals of 1800-2000 calories and 80-100 grams protein and states he is following his eating plan approximately 75 % of the time. He states he is walking a couple days per week.   Interval History:  Since last office visit he has gained 4 pounds.     Pharmacotherapy for weight loss: He is currently taking Victoza 0.6mg  off label for medical weight loss.  Denies side effects.  Struggling with hunger and cravings especially in the evenings.    Previous pharmacotherapy for medical weight loss:  Previously tried Qsymia, phentermine, Wegovy and Saxenda in the past for medical weight loss.   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 His highest weight prior to surgery was 300 lbs and his nadir weight after surgery was 159 lbs.   PHYSICAL EXAM:  Blood pressure 127/85, pulse 66, temperature 98.1 F (36.7 C), height 5\' 3"  (1.6 m), weight 182 lb (82.6 kg), SpO2 97%. Body mass index is 32.24 kg/m.  General: He is overweight,  cooperative, alert, well developed, and in no acute distress. PSYCH: Has normal mood, affect and thought process.   Extremities: No edema.  Neurologic: No gross sensory or motor deficits. No tremors or fasciculations noted.    DIAGNOSTIC DATA REVIEWED:  BMET    Component Value Date/Time   NA 137 12/20/2013 0839   K 3.9 12/20/2013 0839   CL 100 12/20/2013 0839   CO2 28 12/20/2013 0839   GLUCOSE 87 12/20/2013 0839   BUN 14 12/20/2013 0839   CREATININE 0.8 12/20/2013 0839   CALCIUM 9.4 12/20/2013 0839   GFRNONAA >90 11/08/2011 1014   GFRAA >90 11/08/2011 1014   Lab Results  Component Value Date   HGBA1C 4.8 01/12/2022   HGBA1C 5.1 12/28/2008   Lab Results  Component Value Date   INSULIN 3.2 01/12/2022   Lab Results  Component Value Date   TSH 1.670 01/12/2022   CBC    Component Value Date/Time   WBC 6.0 12/20/2013 0839   RBC 5.29 12/20/2013 0839   HGB 15.8 12/20/2013 0839   HCT 45.8 12/20/2013 0839   PLT 244.0 12/20/2013 0839   MCV 86.7 12/20/2013 0839   MCH 30.1 11/08/2011 1014   MCHC 34.5 12/20/2013 0839   RDW 13.6 12/20/2013 0839   Iron Studies No results found for: "IRON", "TIBC", "FERRITIN", "IRONPCTSAT" Lipid Panel     Component Value Date/Time   CHOL 144 01/12/2022 1035  TRIG 94 01/12/2022 1035   HDL 52 01/12/2022 1035   CHOLHDL 4 12/20/2013 0839   VLDL 31.6 12/20/2013 0839   LDLCALC 74 01/12/2022 1035   Hepatic Function Panel     Component Value Date/Time   PROT 7.6 12/20/2013 0839   ALBUMIN 4.3 12/20/2013 0839   AST 27 12/20/2013 0839   ALT 29 12/20/2013 0839   ALKPHOS 56 12/20/2013 0839   BILITOT 1.2 12/20/2013 0839   BILIDIR 0.2 12/20/2013 0839      Component Value Date/Time   TSH 1.670 01/12/2022 1035   Nutritional Lab Results  Component Value Date   VD25OH 109.0 (H) 01/12/2022     ASSESSMENT AND PLAN  TREATMENT PLAN FOR OBESITY:  Recommended Dietary Goals  Rihaan is currently in the action stage of change. As such, his  goal is to continue weight management plan. He has agreed to keeping a food journal and adhering to recommended goals of 1200 calories and 75+ protein.  Behavioral Intervention  We discussed the following Behavioral Modification Strategies today: increasing lean protein intake, decreasing simple carbohydrates , increasing vegetables, increasing lower glycemic fruits, increasing water intake, decreasing eating out or consumption of processed foods, and making healthy choices when eating convenient foods, avoiding temptations and identifying enticing environmental cues, continue to practice mindfulness when eating, and planning for success.  Additional resources provided today: NA  Recommended Physical Activity Goals  Abram has been advised to work up to 150 minutes of moderate intensity aerobic activity a week and strengthening exercises 2-3 times per week for cardiovascular health, weight loss maintenance and preservation of muscle mass.   He has agreed to Think about ways to increase daily physical activity and overcoming barriers to exercise and Increase physical activity in their day and reduce sedentary time (increase NEAT).   Pharmacotherapy We discussed various medication options to help Carlen with his weight loss efforts and we both agreed to continue Victoza 0.6mg  and start Lomaira 8mg  afternoon to help with afternoon/evening polyphagia and cravings.  ASSOCIATED CONDITIONS ADDRESSED TODAY  Action/Plan  Polyphagia -     Lomaira; Take 1 tablet (8 mg total) by mouth daily.  Dispense: 30 tablet; Refill: 0  Generalized obesity -     Lomaira; Take 1 tablet (8 mg total) by mouth daily.  Dispense: 30 tablet; Refill: 0  BMI 32.0-32.9,adult      Will check IC at next visit.     Return in about 3 months (around 05/11/2023).Marland Kitchen He was informed of the importance of frequent follow up visits to maximize his success with intensive lifestyle modifications for his multiple health  conditions.   ATTESTASTION STATEMENTS:  Reviewed by clinician on day of visit: allergies, medications, problem list, medical history, surgical history, family history, social history, and previous encounter notes.      Theodis Sato. Nachman Sundt FNP-C

## 2023-03-11 ENCOUNTER — Ambulatory Visit: Payer: BC Managed Care – PPO | Admitting: Nurse Practitioner

## 2023-03-11 ENCOUNTER — Encounter: Payer: Self-pay | Admitting: Nurse Practitioner

## 2023-03-11 VITALS — BP 130/85 | HR 66 | Temp 98.2°F | Ht 63.0 in | Wt 185.0 lb

## 2023-03-11 DIAGNOSIS — R632 Polyphagia: Secondary | ICD-10-CM

## 2023-03-11 DIAGNOSIS — E669 Obesity, unspecified: Secondary | ICD-10-CM | POA: Diagnosis not present

## 2023-03-11 DIAGNOSIS — R0602 Shortness of breath: Secondary | ICD-10-CM

## 2023-03-11 DIAGNOSIS — Z6832 Body mass index (BMI) 32.0-32.9, adult: Secondary | ICD-10-CM | POA: Diagnosis not present

## 2023-03-11 MED ORDER — LIRAGLUTIDE 18 MG/3ML ~~LOC~~ SOPN
PEN_INJECTOR | SUBCUTANEOUS | 0 refills | Status: DC
Start: 2023-03-11 — End: 2023-06-29

## 2023-03-11 MED ORDER — LOMAIRA 8 MG PO TABS
1.0000 | ORAL_TABLET | Freq: Every day | ORAL | 0 refills | Status: DC
Start: 2023-03-11 — End: 2023-07-29

## 2023-03-11 NOTE — Progress Notes (Signed)
Office: [phone]  /  Fax: [phone]  WEIGHT SUMMARY AND BIOMETRICS  Weight Lost Since Last Visit: 0lb  Weight Gained Since Last Visit: 3lb   Vitals Temp: 98.2 F (36.8 C) BP: 130/85 Pulse Rate: 66 SpO2: 100 %   Anthropometric Measurements Height: 5\' 3"  (1.6 m) Weight: 185 lb (83.9 kg) BMI (Calculated): 32.78 Weight at Last Visit: 182lb Weight Lost Since Last Visit: 0lb Weight Gained Since Last Visit: 3lb Starting Weight: 172lb Total Weight Loss (lbs): 0 lb (0 kg)   Body Composition  Body Fat %: 29.9 % Fat Mass (lbs): 55.4 lbs Muscle Mass (lbs): 123.6 lbs Total Body Water (lbs): 91.6 lbs Visceral Fat Rating : 16   Other Clinical Data RMR: 1915 Fasting: Yes Labs: No Today's Visit #: 10 Starting Date: 01/12/22     HPI  Chief Complaint: OBESITY  Brad Barrera is here to discuss his progress with his obesity treatment plan. He is on the keeping a food journal and adhering to recommended goals of 1800-2000 calories and 80-100 protein and states he is following his eating plan approximately 90 % of the time. He states he is exercising 0 minutes 0 days per week.   Interval History:  Since last office visit he has gained 3 pounds.     Pharmacotherapy for weight loss:   He is currently taking Victoza 1.2 mg off label and Lomaira 8mg  for medical weight loss.  Denies side effects.  He continues to struggle with hunger and cravings especially in the evenings.  He feels like Victoza and Lasandra Beech helps him "to maintain".   Previous pharmacotherapy for medical weight loss:  Previously tried Qsymia, phentermine, Wegovy and Saxenda in the past for medical weight loss. Felt that Wegovy works the best for him.    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 His highest weight prior to surgery was 300 lbs and his nadir weight after surgery was 159 lbs.   Shortness of breath Last IC was 1584 on 01/12/22 Today IC was  1915 Improved    PHYSICAL EXAM:  Blood pressure 130/85, pulse 66, temperature 98.2 F (36.8 C), height 5\' 3"  (1.6 m), weight 185 lb (83.9 kg), SpO2 100%. Body mass index is 32.77 kg/m.  General: He is overweight, cooperative, alert, well developed, and in no acute distress. PSYCH: Has normal mood, affect and thought process.   Extremities: No edema.  Neurologic: No gross sensory or motor deficits. No tremors or fasciculations noted.    DIAGNOSTIC DATA REVIEWED:  BMET    Component Value Date/Time   NA 137 12/20/2013 0839   K 3.9 12/20/2013 0839   CL 100 12/20/2013 0839   CO2 28 12/20/2013 0839   GLUCOSE 87 12/20/2013 0839   BUN 14 12/20/2013 0839   CREATININE 0.8 12/20/2013 0839   CALCIUM 9.4 12/20/2013 0839   GFRNONAA >90 11/08/2011 1014   GFRAA >90 11/08/2011 1014   Lab Results  Component Value Date   HGBA1C 4.8 01/12/2022   HGBA1C 5.1 12/28/2008   Lab Results  Component Value Date   INSULIN 3.2 01/12/2022   Lab Results  Component Value Date   TSH 1.670 01/12/2022   CBC    Component Value Date/Time   WBC 6.0 12/20/2013 0839   RBC 5.29 12/20/2013 0839   HGB 15.8 12/20/2013 0839   HCT 45.8 12/20/2013 0839   PLT 244.0 12/20/2013 0839   MCV 86.7 12/20/2013 0839   MCH 30.1 11/08/2011 1014  MCHC 34.5 12/20/2013 0839   RDW 13.6 12/20/2013 0839   Iron Studies No results found for: "IRON", "TIBC", "FERRITIN", "IRONPCTSAT" Lipid Panel     Component Value Date/Time   CHOL 144 01/12/2022 1035   TRIG 94 01/12/2022 1035   HDL 52 01/12/2022 1035   CHOLHDL 4 12/20/2013 0839   VLDL 31.6 12/20/2013 0839   LDLCALC 74 01/12/2022 1035   Hepatic Function Panel     Component Value Date/Time   PROT 7.6 12/20/2013 0839   ALBUMIN 4.3 12/20/2013 0839   AST 27 12/20/2013 0839   ALT 29 12/20/2013 0839   ALKPHOS 56 12/20/2013 0839   BILITOT 1.2 12/20/2013 0839   BILIDIR 0.2 12/20/2013 0839      Component Value Date/Time   TSH 1.670 01/12/2022 1035    Nutritional Lab Results  Component Value Date   VD25OH 109.0 (H) 01/12/2022     ASSESSMENT AND PLAN  TREATMENT PLAN FOR OBESITY:  Recommended Dietary Goals  Phelan is currently in the action stage of change. As such, his goal is to continue weight management plan. He has agreed to keeping a food journal and adhering to recommended goals of 1300 calories and 80+ protein.  Behavioral Intervention  We discussed the following Behavioral Modification Strategies today: increasing lean protein intake to established goals, increasing vegetables, work on meal planning and preparation, work on tracking and journaling calories using tracking application, reading food labels , keeping healthy foods at home, and continue to work on maintaining a reduced calorie state, getting the recommended amount of protein, incorporating whole foods, making healthy choices, staying well hydrated and practicing mindfulness when eating..  Additional resources provided today: NA  Recommended Physical Activity Goals  Elin has been advised to work up to 150 minutes of moderate intensity aerobic activity a week and strengthening exercises 2-3 times per week for cardiovascular health, weight loss maintenance and preservation of muscle mass.   He has agreed to Think about enjoyable ways to increase daily physical activity and overcoming barriers to exercise and Increase physical activity in their day and reduce sedentary time (increase NEAT).   Pharmacotherapy We discussed various medication options to help Mavrick with his weight loss efforts and we both agreed to continue Victoza and increase to 1.8mg .  Continue Lomaira 8mg .  Side effects discussed   ASSOCIATED CONDITIONS ADDRESSED TODAY  Action/Plan  SOB (shortness of breath) IC improved.  Continue working on dietary changes, increase protein intake, increase water intake and exercise.    Polyphagia Lasandra Beech; Take 1 tablet (8 mg total) by mouth  daily.  Dispense: 30 tablet; Refill: 0  Generalized obesity -     Liraglutide; Take 1.8mg  SQ daily.  Dispense: 9 mL; Refill: 0 -     Lomaira; Take 1 tablet (8 mg total) by mouth daily.  Dispense: 30 tablet; Refill: 0  BMI 32.0-32.9,adult         Return in about 2 months (around 05/11/2023).Marland Kitchen He was informed of the importance of frequent follow up visits to maximize his success with intensive lifestyle modifications for his multiple health conditions.   ATTESTASTION STATEMENTS:  Reviewed by clinician on day of visit: allergies, medications, problem list, medical history, surgical history, family history, social history, and previous encounter notes.     Theodis Sato. Ainsleigh Kakos FNP-C

## 2023-04-22 ENCOUNTER — Other Ambulatory Visit (HOSPITAL_BASED_OUTPATIENT_CLINIC_OR_DEPARTMENT_OTHER): Payer: Self-pay

## 2023-05-02 ENCOUNTER — Other Ambulatory Visit: Payer: Self-pay | Admitting: Nurse Practitioner

## 2023-05-02 DIAGNOSIS — E669 Obesity, unspecified: Secondary | ICD-10-CM

## 2023-05-18 ENCOUNTER — Ambulatory Visit: Payer: BC Managed Care – PPO | Admitting: Nurse Practitioner

## 2023-06-29 ENCOUNTER — Ambulatory Visit: Payer: 59 | Admitting: Nurse Practitioner

## 2023-06-29 ENCOUNTER — Other Ambulatory Visit: Payer: Self-pay | Admitting: Nurse Practitioner

## 2023-06-29 ENCOUNTER — Encounter: Payer: Self-pay | Admitting: Nurse Practitioner

## 2023-06-29 VITALS — BP 135/81 | HR 79 | Temp 98.2°F | Ht 63.0 in | Wt 204.0 lb

## 2023-06-29 DIAGNOSIS — R632 Polyphagia: Secondary | ICD-10-CM

## 2023-06-29 DIAGNOSIS — K912 Postsurgical malabsorption, not elsewhere classified: Secondary | ICD-10-CM | POA: Diagnosis not present

## 2023-06-29 DIAGNOSIS — E669 Obesity, unspecified: Secondary | ICD-10-CM

## 2023-06-29 DIAGNOSIS — Z9884 Bariatric surgery status: Secondary | ICD-10-CM

## 2023-06-29 DIAGNOSIS — Z6836 Body mass index (BMI) 36.0-36.9, adult: Secondary | ICD-10-CM

## 2023-06-29 MED ORDER — INSULIN PEN NEEDLE 31G X 5 MM MISC: 0 refills | Status: DC

## 2023-06-29 MED ORDER — LIRAGLUTIDE 18 MG/3ML ~~LOC~~ SOPN: PEN_INJECTOR | SUBCUTANEOUS | 0 refills | Status: DC

## 2023-06-29 MED ORDER — INSULIN PEN NEEDLE 31G X 5 MM MISC
0 refills | Status: DC
Start: 2023-06-29 — End: 2023-06-29

## 2023-06-29 MED ORDER — LIRAGLUTIDE 18 MG/3ML ~~LOC~~ SOPN
PEN_INJECTOR | SUBCUTANEOUS | 0 refills | Status: DC
Start: 2023-06-29 — End: 2023-06-29

## 2023-06-29 NOTE — Progress Notes (Signed)
Office: [phone]  /  Fax: [phone]  WEIGHT SUMMARY AND BIOMETRICS  Weight Lost Since Last Visit: 0lb  Weight Gained Since Last Visit: 19lb   Vitals Temp: 98.2 F (36.8 C) BP: 135/81 Pulse Rate: 79 SpO2: 97 %   Anthropometric Measurements Height: 5\' 3"  (1.6 m) Weight: 204 lb (92.5 kg) BMI (Calculated): 36.15 Weight at Last Visit: 185lb Weight Lost Since Last Visit: 0lb Weight Gained Since Last Visit: 19lb Starting Weight: 172lb Total Weight Loss (lbs): 0 lb (0 kg)   Body Composition  Body Fat %: 34 % Fat Mass (lbs): 69.4 lbs Muscle Mass (lbs): 127.8 lbs Total Body Water (lbs): 96.8 lbs Visceral Fat Rating : 19   Other Clinical Data Fasting: Yes Labs: No Today's Visit #: 11 Starting Date: 01/12/22     HPI  Chief Complaint: OBESITY  Brad Barrera is here to discuss his progress with his obesity treatment plan. He is on the keeping a food journal and adhering to recommended goals of 2200 calories and 80-100 protein and states he is following his eating plan approximately 80-85 % of the time. He states he is exercising 0 minutes 0 days per week.   Interval History:  Since last office visit he gained 19 pounds.  He notes that he is stress eating.  Struggles with cravings and polyphagia.  Gets off track on the weekends.  He is drinking coffee, water with flavoring, diet soda, unsweetened tea.  He has not been exercising.    24 hour food recall: BF:  1 egg, toast Snack: protein bar or cottage cheese or yogurt Lunch:  lean cuisine  Dinner:  protein and vegetable  Pharmacotherapy for weight loss:   He is not currently taking meds for medical weight loss.    Previous pharmacotherapy for medical weight loss:  Previously tried Qsymia, phentermine, Wegovy, Victoza, lomaira and Saxenda in the past for medical weight loss. Felt that Wegovy works the best for him.     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 His  highest weight prior to surgery was 300 lbs and his nadir weight after surgery was 159 lbs.     PHYSICAL EXAM:  Blood pressure 135/81, pulse 79, temperature 98.2 F (36.8 C), height 5\' 3"  (1.6 m), weight 204 lb (92.5 kg), SpO2 97%. Body mass index is 36.14 kg/m.  General: He is overweight, cooperative, alert, well developed, and in no acute distress. PSYCH: Has normal mood, affect and thought process.   Extremities: No edema.  Neurologic: No gross sensory or motor deficits. No tremors or fasciculations noted.    DIAGNOSTIC DATA REVIEWED:  BMET    Component Value Date/Time   NA 137 12/20/2013 0839   K 3.9 12/20/2013 0839   CL 100 12/20/2013 0839   CO2 28 12/20/2013 0839   GLUCOSE 87 12/20/2013 0839   BUN 14 12/20/2013 0839   CREATININE 0.8 12/20/2013 0839   CALCIUM 9.4 12/20/2013 0839   GFRNONAA >90 11/08/2011 1014   GFRAA >90 11/08/2011 1014   Lab Results  Component Value Date   HGBA1C 4.8 01/12/2022   HGBA1C 5.1 12/28/2008   Lab Results  Component Value Date   INSULIN 3.2 01/12/2022   Lab Results  Component Value Date   TSH 1.670 01/12/2022   CBC    Component Value Date/Time   WBC 6.0 12/20/2013 0839   RBC 5.29 12/20/2013 0839   HGB 15.8 12/20/2013 0839   HCT 45.8 12/20/2013 0839  PLT 244.0 12/20/2013 0839   MCV 86.7 12/20/2013 0839   MCH 30.1 11/08/2011 1014   MCHC 34.5 12/20/2013 0839   RDW 13.6 12/20/2013 0839   Iron Studies No results found for: "IRON", "TIBC", "FERRITIN", "IRONPCTSAT" Lipid Panel     Component Value Date/Time   CHOL 144 01/12/2022 1035   TRIG 94 01/12/2022 1035   HDL 52 01/12/2022 1035   CHOLHDL 4 12/20/2013 0839   VLDL 31.6 12/20/2013 0839   LDLCALC 74 01/12/2022 1035   Hepatic Function Panel     Component Value Date/Time   PROT 7.6 12/20/2013 0839   ALBUMIN 4.3 12/20/2013 0839   AST 27 12/20/2013 0839   ALT 29 12/20/2013 0839   ALKPHOS 56 12/20/2013 0839   BILITOT 1.2 12/20/2013 0839   BILIDIR 0.2 12/20/2013 0839       Component Value Date/Time   TSH 1.670 01/12/2022 1035   Nutritional Lab Results  Component Value Date   VD25OH 109.0 (H) 01/12/2022     ASSESSMENT AND PLAN  TREATMENT PLAN FOR OBESITY:  Recommended Dietary Goals  Ural is currently in the action stage of change. As such, his goal is to continue weight management plan. He has agreed to keeping a food journal and adhering to recommended goals of 1400 calories and 100 protein.  Behavioral Intervention  We discussed the following Behavioral Modification Strategies today: increasing lean protein intake to established goals, decreasing simple carbohydrates , increasing vegetables, avoiding skipping meals, increasing water intake , reading food labels , keeping healthy foods at home, work on managing stress, creating time for self-care and relaxation, avoiding temptations and identifying enticing environmental cues, continue to work on implementation of reduced calorie nutritional plan, and continue to work on maintaining a reduced calorie state, getting the recommended amount of protein, incorporating whole foods, making healthy choices, staying well hydrated and practicing mindfulness when eating..  Additional resources provided today: NA  Recommended Physical Activity Goals  Jamaris has been advised to work up to 150 minutes of moderate intensity aerobic activity a week and strengthening exercises 2-3 times per week for cardiovascular health, weight loss maintenance and preservation of muscle mass.   He has agreed to Think about enjoyable ways to increase daily physical activity and overcoming barriers to exercise, Increase physical activity in their day and reduce sedentary time (increase NEAT)., and Work on scheduling and tracking physical activity.    Pharmacotherapy We discussed various medication options to help Obadiah with his weight loss efforts and we both agreed to start Victoza 0.6mg  SQ daily for one week and then  increase to 1.2mg  SQ daily.  Will consider Zepbound at his next visit.    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) Trying to get pregnant Breastfeeding Use with caution with taking insulin or sulfonylureas (will need to monitor blood sugars for hypoglycemia)  ASSOCIATED CONDITIONS ADDRESSED TODAY  Action/Plan  Polyphagia -     Liraglutide; Take 0.6mg  SQ daily x 1 week and then increase to 1.2mg  SQ daily  Dispense: 6 mL; Refill: 0 -     Insulin Pen Needle; Use once daily SQ with Victoza  Dispense: 100 each; Refill: 0  Postoperative malabsorption -     Vitamin B1 -     Prealbumin -     Folate -     Ferritin -     Vitamin B12 -     VITAMIN D  25 Hydroxy (Vit-D Deficiency, Fractures) -     TSH -     Lipid Panel With LDL/HDL Ratio -     Comprehensive metabolic panel -     CBC with Differential/Platelet  S/P laparoscopic sleeve gastrectomy -     Vitamin B1 -     Prealbumin -     Folate -     Ferritin -     Vitamin B12 -     VITAMIN D 25 Hydroxy (Vit-D Deficiency, Fractures) -     TSH -     Lipid Panel With LDL/HDL Ratio -     Comprehensive metabolic panel -     CBC with Differential/Platelet  Generalized obesity -     Liraglutide; Take 0.6mg  SQ daily x 1 week and then increase to 1.2mg  SQ daily  Dispense: 6 mL; Refill: 0 -     Insulin Pen Needle; Use once daily SQ with Victoza  Dispense: 100 each; Refill: 0  BMI 36.0-36.9,adult         Return in about 4 weeks (around 07/27/2023).Marland Kitchen He was informed of the importance of frequent follow up visits to maximize his success with intensive lifestyle modifications for his multiple health conditions.   ATTESTASTION STATEMENTS:  Reviewed by clinician on day of visit: allergies, medications, problem list, medical history, surgical history, family history, social history, and  previous encounter notes.     Theodis Sato. Cameo Shewell FNP-C

## 2023-06-29 NOTE — Patient Instructions (Signed)

## 2023-07-03 LAB — FOLATE: Folate: 14.5 ng/mL (ref >3.0)

## 2023-07-03 LAB — COMPREHENSIVE METABOLIC PANEL
ALT: 24 [IU]/L (ref 0–44)
AST: 19 [IU]/L (ref 0–40)
Albumin: 4.4 g/dL (ref 3.8–4.9)
Alkaline Phosphatase: 58 [IU]/L (ref 44–121)
BUN/Creatinine Ratio: 16 (ref 9–20)
BUN: 14 mg/dL (ref 6–24)
Bilirubin Total: 0.7 mg/dL (ref 0.0–1.2)
CO2: 25 mmol/L (ref 20–29)
Calcium: 9.5 mg/dL (ref 8.7–10.2)
Chloride: 102 mmol/L (ref 96–106)
Creatinine, Ser: 0.89 mg/dL (ref 0.76–1.27)
Globulin, Total: 2.6 g/dL (ref 1.5–4.5)
Glucose: 90 mg/dL (ref 70–99)
Potassium: 4.6 mmol/L (ref 3.5–5.2)
Sodium: 139 mmol/L (ref 134–144)
Total Protein: 7 g/dL (ref 6.0–8.5)
eGFR: 102 mL/min/{1.73_m2} (ref >59)

## 2023-07-03 LAB — CBC WITH DIFFERENTIAL/PLATELET
Basophils Absolute: 0 10*3/uL (ref 0.0–0.2)
Basos: 1 %
EOS (ABSOLUTE): 0 10*3/uL (ref 0.0–0.4)
Eos: 1 %
Hematocrit: 48.6 % (ref 37.5–51.0)
Hemoglobin: 16 g/dL (ref 13.0–17.7)
Immature Grans (Abs): 0 10*3/uL (ref 0.0–0.1)
Immature Granulocytes: 0 %
Lymphocytes Absolute: 1.7 10*3/uL (ref 0.7–3.1)
Lymphs: 36 %
MCH: 29.9 pg (ref 26.6–33.0)
MCHC: 32.9 g/dL (ref 31.5–35.7)
MCV: 91 fL (ref 79–97)
Monocytes Absolute: 0.3 10*3/uL (ref 0.1–0.9)
Monocytes: 7 %
Neutrophils Absolute: 2.6 10*3/uL (ref 1.4–7.0)
Neutrophils: 55 %
Platelets: 234 10*3/uL (ref 150–450)
RBC: 5.36 x10E6/uL (ref 4.14–5.80)
RDW: 12.4 % (ref 11.6–15.4)
WBC: 4.7 10*3/uL (ref 3.4–10.8)

## 2023-07-03 LAB — FERRITIN: Ferritin: 124 ng/mL (ref 30–400)

## 2023-07-03 LAB — LIPID PANEL WITH LDL/HDL RATIO
Cholesterol, Total: 200 mg/dL — ABNORMAL HIGH (ref 100–199)
HDL: 56 mg/dL (ref >39)
LDL Chol Calc (NIH): 119 mg/dL — ABNORMAL HIGH (ref 0–99)
LDL/HDL Ratio: 2.1 {ratio} (ref 0.0–3.6)
Triglycerides: 143 mg/dL (ref 0–149)
VLDL Cholesterol Cal: 25 mg/dL (ref 5–40)

## 2023-07-03 LAB — PREALBUMIN: PREALBUMIN: 27 mg/dL (ref 10–36)

## 2023-07-03 LAB — VITAMIN D 25 HYDROXY (VIT D DEFICIENCY, FRACTURES): Vit D, 25-Hydroxy: 35 ng/mL (ref 30.0–100.0)

## 2023-07-03 LAB — TSH: TSH: 2.13 u[IU]/mL (ref 0.450–4.500)

## 2023-07-03 LAB — VITAMIN B1: Thiamine: 136.5 nmol/L (ref 66.5–200.0)

## 2023-07-03 LAB — VITAMIN B12: Vitamin B-12: 485 pg/mL (ref 232–1245)

## 2023-07-06 ENCOUNTER — Encounter: Payer: 59 | Admitting: Family Medicine

## 2023-07-20 ENCOUNTER — Emergency Department (HOSPITAL_BASED_OUTPATIENT_CLINIC_OR_DEPARTMENT_OTHER)
Admission: EM | Admit: 2023-07-20 | Discharge: 2023-07-20 | Disposition: A | Discharge status: Home / Self Care | Payer: 59 | Attending: Emergency Medicine | Admitting: Emergency Medicine

## 2023-07-20 ENCOUNTER — Emergency Department (HOSPITAL_BASED_OUTPATIENT_CLINIC_OR_DEPARTMENT_OTHER): Payer: 59

## 2023-07-20 ENCOUNTER — Other Ambulatory Visit: Payer: Self-pay

## 2023-07-20 ENCOUNTER — Encounter (HOSPITAL_BASED_OUTPATIENT_CLINIC_OR_DEPARTMENT_OTHER): Payer: Self-pay | Admitting: *Deleted

## 2023-07-20 DIAGNOSIS — N200 Calculus of kidney: Secondary | ICD-10-CM

## 2023-07-20 DIAGNOSIS — I10 Essential (primary) hypertension: Secondary | ICD-10-CM | POA: Insufficient documentation

## 2023-07-20 DIAGNOSIS — N132 Hydronephrosis with renal and ureteral calculous obstruction: Secondary | ICD-10-CM | POA: Insufficient documentation

## 2023-07-20 DIAGNOSIS — Z91010 Allergy to peanuts: Secondary | ICD-10-CM | POA: Diagnosis not present

## 2023-07-20 DIAGNOSIS — R109 Unspecified abdominal pain: Secondary | ICD-10-CM | POA: Diagnosis present

## 2023-07-20 LAB — BASIC METABOLIC PANEL
Anion gap: 8 (ref 5–15)
BUN: 20 mg/dL (ref 6–20)
CO2: 26 mmol/L (ref 22–32)
Calcium: 9.4 mg/dL (ref 8.9–10.3)
Chloride: 104 mmol/L (ref 98–111)
Creatinine, Ser: 0.95 mg/dL (ref 0.61–1.24)
GFR, Estimated: >60 mL/min (ref >60)
Glucose, Bld: 123 mg/dL — ABNORMAL HIGH (ref 70–99)
Potassium: 3.7 mmol/L (ref 3.5–5.1)
Sodium: 138 mmol/L (ref 135–145)

## 2023-07-20 LAB — CBC
HCT: 45.2 % (ref 39.0–52.0)
Hemoglobin: 15.7 g/dL (ref 13.0–17.0)
MCH: 30.1 pg (ref 26.0–34.0)
MCHC: 34.7 g/dL (ref 30.0–36.0)
MCV: 86.8 fL (ref 80.0–100.0)
Platelets: 257 10*3/uL (ref 150–400)
RBC: 5.21 MIL/uL (ref 4.22–5.81)
RDW: 12.6 % (ref 11.5–15.5)
WBC: 6.8 10*3/uL (ref 4.0–10.5)
nRBC: 0 % (ref 0.0–0.2)

## 2023-07-20 LAB — URINALYSIS, ROUTINE W REFLEX MICROSCOPIC
Bilirubin Urine: NEGATIVE
Glucose, UA: NEGATIVE mg/dL
Ketones, ur: NEGATIVE mg/dL
Leukocytes,Ua: NEGATIVE
Nitrite: NEGATIVE
Protein, ur: 30 mg/dL — AB
Specific Gravity, Urine: >=1.03 (ref 1.005–1.030)
pH: 5.5 (ref 5.0–8.0)

## 2023-07-20 LAB — HEPATIC FUNCTION PANEL
ALT: 28 U/L (ref 0–44)
AST: 25 U/L (ref 15–41)
Albumin: 4.3 g/dL (ref 3.5–5.0)
Alkaline Phosphatase: 50 U/L (ref 38–126)
Bilirubin, Direct: <0.1 mg/dL (ref 0.0–0.2)
Total Bilirubin: 0.7 mg/dL (ref 0.0–1.2)
Total Protein: 7.6 g/dL (ref 6.5–8.1)

## 2023-07-20 LAB — URINALYSIS, MICROSCOPIC (REFLEX): RBC / HPF: >50 RBC/hpf (ref 0–5)

## 2023-07-20 MED ORDER — ONDANSETRON HCL 4 MG/2ML IJ SOLN
4.0000 mg | Freq: Once | INTRAMUSCULAR | Status: AC
  Administered 2023-07-20: 4 mg via INTRAVENOUS
  Filled 2023-07-20: qty 2

## 2023-07-20 MED ORDER — KETOROLAC TROMETHAMINE 15 MG/ML IJ SOLN
15.0000 mg | Freq: Once | INTRAMUSCULAR | Status: AC
Start: 2023-07-20 — End: 2023-07-20
  Administered 2023-07-20: 15 mg via INTRAVENOUS
  Filled 2023-07-20: qty 1

## 2023-07-20 MED ORDER — TAMSULOSIN HCL 0.4 MG PO CAPS
0.4000 mg | ORAL_CAPSULE | Freq: Every day | ORAL | 0 refills | Status: DC
  Filled 2023-07-20: qty 30, 30d supply, fill #0

## 2023-07-20 MED ORDER — SODIUM CHLORIDE 0.9 % IV BOLUS
1000.0000 mL | Freq: Once | INTRAVENOUS | Status: AC
  Administered 2023-07-20: 1000 mL via INTRAVENOUS

## 2023-07-20 MED ORDER — ONDANSETRON 4 MG PO TBDP
4.0000 mg | ORAL_TABLET | Freq: Three times a day (TID) | ORAL | 0 refills | Status: DC | PRN
  Filled 2023-07-20: qty 18, 21d supply, fill #0

## 2023-07-20 MED ORDER — OXYCODONE-ACETAMINOPHEN 5-325 MG PO TABS
1.0000 | ORAL_TABLET | Freq: Four times a day (QID) | ORAL | 0 refills | Status: DC | PRN
  Filled 2023-07-20: qty 10, 3d supply, fill #0

## 2023-07-20 NOTE — ED Triage Notes (Signed)
 Right flank pain since Saturday.  Pt has hx of kidney stone and states that this feels like a kidney stone with pain radiating to right testicle, pt has been pushing fluids at home but has not been able to get it to pass

## 2023-07-20 NOTE — ED Provider Notes (Signed)
 [facility] Provider Note   CSN: [id number] Arrival date & time: 07/20/23  1605     History  Chief Complaint  Patient presents with   Flank Pain    Brad Barrera is a 53 y.o. male.  Patient with history of hypertension, hyperlipidemia, obesity, kidney stones presents today with complaints of flank pain. States that pain is in his right side and radiates into his right testicle. Does endorse some nausea without vomiting or diarrhea. He is having regular bowel movements and passing flatus. He is also urinating regularly. States pain feels like kidney stones he has had previously. Does note that he has had several surgical interventions to have his kidney stones removed previously. He did [facility] to get a follow-up appointment, however they cannot see him until next week and he didn't feel like he could wait that long with his pain. States his pain started on Saturday. He has been increasing his fluid intake to try and pass the stone without success. Denies fevers or chills. No dysuria.  The history is provided by the patient. No language interpreter was used.  Flank Pain       Home Medications Prior to Admission medications   Medication Sig Start Date End Date Taking? Authorizing Provider  albuterol (VENTOLIN HFA) 108 (90 Base) MCG/ACT inhaler Inhale 2 puffs into the lungs every 6 (six) hours as needed. 02/08/23     CALCIUM CITRATE PO Take 1,200 mg by mouth daily at 12 noon.    [provider]  emtricitabine-tenofovir AF (DESCOVY) 200-25 MG tablet Take 1 tablet by mouth daily. 10/20/21   [provider]  EPINEPHrine 0.3 mg/0.3 mL IJ SOAJ injection Use as directed 10/06/21     EPINEPHrine 0.3 mg/0.3 mL IJ SOAJ injection Inject into the muscle. 10/06/21   [provider]  fluticasone (FLONASE) 50 MCG/ACT nasal spray PLACE 1 SPRAY INTO THE NOSE DAILY AS NEEDED. SEASONAL ALLERGIES 09/01/21      fluticasone (FLONASE) 50 MCG/ACT nasal spray Place into the nose. 10/20/12   [provider]  fluticasone (FLONASE) 50 MCG/ACT nasal spray Place 2 sprays into both nostrils daily. 04/13/22     fluticasone (FLONASE) 50 MCG/ACT nasal spray Place 2 sprays into both nostrils daily. 12/21/22     fluticasone (FLONASE) 50 MCG/ACT nasal spray Place 2 sprays into both nostrils daily. 02/08/23     GLUCOSAMINE HCL-MSM PO Take by mouth. Unknown dose    [provider]  Insulin Pen Needle 31G X 5 MM MISC Use once daily SQ with Victoza 06/29/23   Irene Limbo, FNP  liraglutide (VICTOZA) 18 MG/3ML SOPN Take 0.6mg  SQ daily x 1 week and then increase to 1.2mg  SQ daily 06/29/23   Irene Limbo, FNP  loratadine (CLARITIN) 10 MG tablet Take 10 mg by mouth daily.    [provider]  loratadine (CLARITIN) 10 MG tablet Take by mouth.    [provider]  Multiple Vitamin (MULTIVITAMIN) tablet Take 1 tablet by mouth daily.    [provider]  nirmatrelvir & ritonavir (PAXLOVID, 300/100,) 20 x 150 MG & 10 x 100MG  TBPK Take three tablets by mouth 2 (two) times daily for 5 days. Take two (2) nirmatrelvir 150 mg tablets and one (1) ritonavir 100 mg tablet as indicated on the medication dosing blister pack. 11/24/22     Phentermine HCl (LOMAIRA) 8 MG TABS Take 1 tablet (8 mg total) by mouth daily. Patient not taking:  Reported on 06/29/2023 03/11/23   Irene Limbo, FNP  valACYclovir (VALTREX) 1000 MG tablet Take by mouth. 06/30/19   [provider]      Allergies    Hydromorphone hcl, Ivp dye [iodinated contrast media], Other, Peanut-containing drug products, and Shellfish-derived products    Review of Systems   Review of Systems  Genitourinary:  Positive for flank pain.  All other systems reviewed and are negative.   Physical Exam Updated Vital Signs BP (!) 153/104   Pulse (!) 57   Temp 98.4 F (36.9 C)   Resp 16   SpO2 96%  Physical  Exam Vitals and nursing note reviewed.  Constitutional:      General: He is not in acute distress.    Appearance: Normal appearance. He is normal weight. He is not ill-appearing, toxic-appearing or diaphoretic.  HENT:     Head: Normocephalic and atraumatic.  Cardiovascular:     Rate and Rhythm: Normal rate.  Pulmonary:     Effort: Pulmonary effort is normal. No respiratory distress.  Abdominal:     General: Abdomen is flat.     Palpations: Abdomen is soft.     Tenderness: There is no abdominal tenderness. There is no right CVA tenderness or left CVA tenderness.  Musculoskeletal:        General: Normal range of motion.     Cervical back: Normal range of motion.  Skin:    General: Skin is warm and dry.  Neurological:     General: No focal deficit present.     Mental Status: He is alert.  Psychiatric:        Mood and Affect: Mood normal.        Behavior: Behavior normal.     ED Results / Procedures / Treatments   Labs (all labs ordered are listed, but only abnormal results are displayed) Labs Reviewed  URINALYSIS, ROUTINE W REFLEX MICROSCOPIC - Abnormal; Notable for the following components:      Result Value   APPearance CLOUDY (*)    Hgb urine dipstick LARGE (*)    Protein, ur 30 (*)    All other components within normal limits  BASIC METABOLIC PANEL - Abnormal; Notable for the following components:   Glucose, Bld 123 (*)    All other components within normal limits  URINALYSIS, MICROSCOPIC (REFLEX) - Abnormal; Notable for the following components:   Bacteria, UA RARE (*)    All other components within normal limits  CBC  HEPATIC FUNCTION PANEL    EKG None  Radiology CT Renal Stone Study Result Date: 07/20/2023 CLINICAL DATA:  Abdominal/flank pain, stone suspected Right flank pain. EXAM: CT ABDOMEN AND PELVIS WITHOUT CONTRAST TECHNIQUE: Multidetector CT imaging of the abdomen and pelvis was performed following the standard protocol without IV contrast. RADIATION  DOSE REDUCTION: This exam was performed according to the departmental dose-optimization program which includes automated exposure control, adjustment of the mA and/or kV according to patient size and/or use of iterative reconstruction technique. COMPARISON:  Remote CT 08/29/2012 FINDINGS: Lower chest: Clear lung bases. Hepatobiliary: Simple cyst in the anterior liver. Multiple additional subcentimeter hypodensities are too small to accurately we characterize. No further follow-up imaging is recommended. Cholecystectomy. Possible dropped gallstone adjacent to the left hepatic lobe, unchanged. No adjacent inflammation or fat stranding. No biliary dilatation. Pancreas: Unremarkable. No pancreatic ductal dilatation or surrounding inflammatory changes. Spleen: Normal in size without focal abnormality. Adrenals/Urinary Tract: No adrenal nodule. 6 mm obstructing stone in the right proximal ureter (at the  level of L4) with mild proximal hydroureteronephrosis. Faint right perinephric edema. There are 2 additional punctate right intrarenal calculi. Two punctate nonobstructing stones in the left kidney without left hydronephrosis. Decompressed left ureter. Minimally distended urinary bladder. No bladder stone. Stomach/Bowel: Small hiatal hernia. The previous gastric band has been removed. Gastric sleeve resection. No bowel obstruction or inflammation. The appendix is normal. Small volume of colonic stool. Vascular/Lymphatic: No aortic aneurysm. Mild iliac atherosclerosis. No enlarged lymph nodes in the abdomen or pelvis. Reproductive: Prostate is unremarkable. Other: No ascites or free air. Small fat containing umbilical and bilateral inguinal hernias. Musculoskeletal: There are no acute or suspicious osseous abnormalities. IMPRESSION: 1. Obstructing 6 mm stone in the right proximal ureter with mild hydroureteronephrosis. 2. Punctate bilateral intrarenal calculi. Electronically Signed   By: Narda Rutherford M.D.   On:  07/20/2023 20:04    Procedures Procedures    Medications Ordered in ED Medications  ketorolac (TORADOL) 15 MG/ML injection 15 mg (15 mg Intravenous Given 07/20/23 1926)  sodium chloride 0.9 % bolus 1,000 mL (1,000 mLs Intravenous New Bag/Given 07/20/23 1925)  ondansetron (ZOFRAN) injection 4 mg (4 mg Intravenous Given 07/20/23 1925)    ED Course/ Medical Decision Making/ A&P                                 Medical Decision Making Amount and/or Complexity of Data Reviewed Labs: ordered. Radiology: ordered.  Risk Prescription drug management.   This patient is a 53 y.o. male who presents to the ED for concern of right flank pain, this involves an extensive number of treatment options, and is a complaint that carries with it a high risk of complications and morbidity. The emergent differential diagnosis prior to evaluation includes, but is not limited to,  AAA, renal vascular thrombosis, mesenteric ischemia, pyelonephritis, nephrolithiasis, cystitis, biliary colic, pancreatitis, PUD, appendicitis, diverticulitis, bowel obstruction, Testicular torsion, Epididymitis   This is not an exhaustive differential.   Past Medical History / Co-morbidities / Social History:  has a past medical history of Anal fissure (12/31/2008), Fatty liver, Food allergy, GERD, HYPERLIPIDEMIA, HYPERTENSION, Morbid obesity (HCC), and NEPHROLITHIASIS.  Additional history: Chart reviewed.  Physical Exam: Physical exam performed. The pertinent findings include: Overall well-appearing, abdomen soft and nontender.  No CVA tenderness.  Lab Tests: I ordered, and personally interpreted labs.  The pertinent results include:  UA with rbcs, noninfectious   Imaging Studies: I ordered imaging studies including CT renal. I independently visualized and interpreted imaging which showed   1. Obstructing 6 mm stone in the right proximal ureter with mild hydroureteronephrosis. 2. Punctate bilateral intrarenal  calculi.  I agree with the radiologist interpretation.  Medications: I ordered medication including toradol, zofran, and fluids  for pain, nausea/vomiting, dehydration. Reevaluation of the patient after these medicines showed that the patient improved. I have reviewed the patients home medicines and have made adjustments as needed.   Disposition: After consideration of the diagnostic results and the patients response to treatment, I feel that emergency department workup does not suggest an emergent condition requiring admission or immediate intervention beyond what has been performed at this time. The plan is: Discharge with urology follow-up and return precautions.  Patient already has a [facility]rology appointment through [facility] next week.  Emphasized importance of him keeping this appointment.  I have also given him Flomax, Zofran, and Percocet for symptomatic management.  PDMP reviewed.  Patient advised not to drive or operate heavy  machinery while taking this medication. Evaluation and diagnostic testing in the emergency department does not suggest an emergent condition requiring admission or immediate intervention beyond what has been performed at this time.  Plan for discharge with close PCP follow-up.  Patient is understanding and amenable with plan, educated on red flag symptoms that would prompt immediate return.  Patient discharged in stable condition.  Final Clinical Impression(s) / ED Diagnoses Final diagnoses:  Kidney stone    Rx / DC Orders ED Discharge Orders          Ordered    tamsulosin (FLOMAX) 0.4 MG CAPS capsule  Daily        07/20/23 2125    ondansetron (ZOFRAN-ODT) 4 MG disintegrating tablet  Every 8 hours PRN        07/20/23 2125    oxyCODONE-acetaminophen (PERCOCET/ROXICET) 5-325 MG tablet  Every 6 hours PRN        07/20/23 2125          An After Visit Summary was printed and given to the patient.     Vear Clock 07/20/23 2201    Melene Plan,  DO 07/20/23 2202

## 2023-07-20 NOTE — ED Notes (Signed)
D/c paperwork reviewed with pt, including prescriptions and follow up care.  No questions or concerns voiced at time of d/c. . Pt verbalized understanding, Ambulatory without assistance to ED exit, NAD.   

## 2023-07-20 NOTE — Discharge Instructions (Addendum)
 As we discussed, you were seen in the emergency department and found to have a kidney stone.  Please take ibuprofen as needed for pain.  We are sending you home with multiple medications to assist with passing the stone and for residual pain/nausea:  -Flomax-this is a medication to help pass the stone, it allows urine to exit the body more freely.  Please take this once daily with a meal.  -Percocet-this is a narcotic/controlled substance medication that has potential addicting qualities.  We recommend that you take 1-2 tablets every 6 hours as needed for severe pain.  Do not drive or operate heavy machinery when taking this medicine as it can be sedating. Do not drink alcohol or take other sedating medications when taking this medicine for safety reasons.  Keep this out of reach of small children.  Please be aware this medicine has Tylenol in it (325 mg/tab) do not exceed the maximum dose of Tylenol in a day per over the counter recommendations should you decide to supplement with Tylenol over the counter.   -Zofran-this is an antinausea medication, you may take this every 8 hours as needed for nausea and vomiting, please allow the tablet to dissolve underneath of your tongue.   We have prescribed you new medication(s) today. Discuss the medications prescribed today with your pharmacist as they can have adverse effects and interactions with your other medicines including over the counter and prescribed medications. Seek medical evaluation if you start to experience new or abnormal symptoms after taking one of these medicines, seek care immediately if you start to experience difficulty breathing, feeling of your throat closing, facial swelling, or rash as these could be indications of a more serious allergic reaction  Please follow-up with urology within 3 to 5 days.  Return to the ER for new or worsening symptoms including but not limited to worsening pain not controlled by these medicines, inability to  keep fluids down, fever, or any other concerns that you may have.

## 2023-07-21 ENCOUNTER — Other Ambulatory Visit (HOSPITAL_BASED_OUTPATIENT_CLINIC_OR_DEPARTMENT_OTHER): Payer: Self-pay

## 2023-07-28 ENCOUNTER — Other Ambulatory Visit: Payer: Self-pay | Admitting: Nurse Practitioner

## 2023-07-28 DIAGNOSIS — R632 Polyphagia: Secondary | ICD-10-CM

## 2023-07-28 DIAGNOSIS — E669 Obesity, unspecified: Secondary | ICD-10-CM

## 2023-07-29 ENCOUNTER — Encounter: Payer: Self-pay | Admitting: Nurse Practitioner

## 2023-07-29 ENCOUNTER — Ambulatory Visit (INDEPENDENT_AMBULATORY_CARE_PROVIDER_SITE_OTHER): Payer: 59 | Admitting: Nurse Practitioner

## 2023-07-29 VITALS — BP 140/98 | HR 79 | Temp 97.8°F | Ht 63.0 in | Wt 203.0 lb

## 2023-07-29 DIAGNOSIS — E669 Obesity, unspecified: Secondary | ICD-10-CM

## 2023-07-29 DIAGNOSIS — Z6835 Body mass index (BMI) 35.0-35.9, adult: Secondary | ICD-10-CM

## 2023-07-29 DIAGNOSIS — E7849 Other hyperlipidemia: Secondary | ICD-10-CM | POA: Diagnosis not present

## 2023-07-29 NOTE — Progress Notes (Addendum)
 Office: [phone]  /  Fax: [phone]  WEIGHT SUMMARY AND BIOMETRICS  Weight Lost Since Last Visit: 1lb  Weight Gained Since Last Visit: 0lb   Vitals Temp: 97.8 F (36.6 C) BP: (!) 140/98 Pulse Rate: 79 SpO2: 98 %   Anthropometric Measurements Height: 5\' 3"  (1.6 m) Weight: 203 lb (92.1 kg) BMI (Calculated): 35.97 Weight at Last Visit: 204lb Weight Lost Since Last Visit: 1lb Weight Gained Since Last Visit: 0lb Starting Weight: 175lb Total Weight Loss (lbs): 0 lb (0 kg)   Body Composition  Body Fat %: 33.5 % Fat Mass (lbs): 68.2 lbs Muscle Mass (lbs): 128.8 lbs Total Body Water (lbs): 98.2 lbs Visceral Fat Rating : 19   Other Clinical Data Fasting: Yes Labs: No Today's Visit #: 12 Starting Date: 01/12/22     HPI  Chief Complaint: OBESITY  Brad Barrera is here to discuss his progress with his obesity treatment plan. He is on the keeping a food journal and adhering to recommended goals of 2200 calories and 80-100 protein and states he is following his eating plan approximately 80 % of the time. He states he is exercising 0 minutes 0 days per week.   Interval History:  Since last office visit he has lost 1 pound.  He is currently struggling with kidney stones.  He has a follow up appt scheduled with urology on 08/24/23. He is drinking water and cranberry juice.    Pharmacotherapy for weight loss: He is currently taking Victoza 1.2mg  (has been off for 5 days).  Denies side effects.   Previous pharmacotherapy for medical weight loss:  Previously tried Qsymia, phentermine, Wegovy, Victoza, lomaira and Saxenda in the past for medical weight loss. Felt that Wegovy works the best for him.     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 His highest weight prior to surgery was 300 lbs and his nadir weight after surgery was 159 lbs.     Hyperlipidemia Medication(s): None. Denies side effects.    Lab Results  Component  Value Date   CHOL 200 (H) 06/29/2023   HDL 56 06/29/2023   LDLCALC 119 (H) 06/29/2023   TRIG 143 06/29/2023   CHOLHDL 4 12/20/2013   Lab Results  Component Value Date   ALT 28 07/20/2023   AST 25 07/20/2023   ALKPHOS 50 07/20/2023   BILITOT 0.7 07/20/2023   The 10-year ASCVD risk score (Arnett DK, et al., 2019) is: 5.4%   Values used to calculate the score:     Age: 53 years     Sex: Male     Is Non-Hispanic African American: No     Diabetic: No     Tobacco smoker: No     Systolic Blood Pressure: 140 mmHg     Is BP treated: No     HDL Cholesterol: 56 mg/dL     Total Cholesterol: 200 mg/dL   PHYSICAL EXAM:  Blood pressure (!) 140/98, pulse 79, temperature 97.8 F (36.6 C), height 5\' 3"  (1.6 m), weight 203 lb (92.1 kg), SpO2 98%. Body mass index is 35.96 kg/m.  General: He is overweight, cooperative, alert, well developed, and in no acute distress. PSYCH: Has normal mood, affect and thought process.   Extremities: No edema.  Neurologic: No gross sensory or motor deficits. No tremors or fasciculations noted.    DIAGNOSTIC DATA REVIEWED:  BMET    Component Value Date/Time   NA 138 07/20/2023 1618   NA 139  06/29/2023 0818   K 3.7 07/20/2023 1618   CL 104 07/20/2023 1618   CO2 26 07/20/2023 1618   GLUCOSE 123 (H) 07/20/2023 1618   BUN 20 07/20/2023 1618   BUN 14 06/29/2023 0818   CREATININE 0.95 07/20/2023 1618   CALCIUM 9.4 07/20/2023 1618   GFRNONAA >60 07/20/2023 1618   GFRAA >90 11/08/2011 1014   Lab Results  Component Value Date   HGBA1C 4.8 01/12/2022   HGBA1C 5.1 12/28/2008   Lab Results  Component Value Date   INSULIN 3.2 01/12/2022   Lab Results  Component Value Date   TSH 2.130 06/29/2023   CBC    Component Value Date/Time   WBC 6.8 07/20/2023 1618   RBC 5.21 07/20/2023 1618   HGB 15.7 07/20/2023 1618   HGB 16.0 06/29/2023 0818   HCT 45.2 07/20/2023 1618   HCT 48.6 06/29/2023 0818   PLT 257 07/20/2023 1618   PLT 234 06/29/2023 0818    MCV 86.8 07/20/2023 1618   MCV 91 06/29/2023 0818   MCH 30.1 07/20/2023 1618   MCHC 34.7 07/20/2023 1618   RDW 12.6 07/20/2023 1618   RDW 12.4 06/29/2023 0818   Iron Studies    Component Value Date/Time   FERRITIN 124 06/29/2023 0818   Lipid Panel     Component Value Date/Time   CHOL 200 (H) 06/29/2023 0818   TRIG 143 06/29/2023 0818   HDL 56 06/29/2023 0818   CHOLHDL 4 12/20/2013 0839   VLDL 31.6 12/20/2013 0839   LDLCALC 119 (H) 06/29/2023 0818   Hepatic Function Panel     Component Value Date/Time   PROT 7.6 07/20/2023 1618   PROT 7.0 06/29/2023 0818   ALBUMIN 4.3 07/20/2023 1618   ALBUMIN 4.4 06/29/2023 0818   AST 25 07/20/2023 1618   ALT 28 07/20/2023 1618   ALKPHOS 50 07/20/2023 1618   BILITOT 0.7 07/20/2023 1618   BILITOT 0.7 06/29/2023 0818   BILIDIR <0.1 07/20/2023 1618   IBILI NOT CALCULATED 07/20/2023 1618      Component Value Date/Time   TSH 2.130 06/29/2023 0818   Nutritional Lab Results  Component Value Date   VD25OH 35.0 06/29/2023   VD25OH 109.0 (H) 01/12/2022     ASSESSMENT AND PLAN  TREATMENT PLAN FOR OBESITY:  Recommended Dietary Goals  Brad Barrera is currently in the action stage of change. As such, his goal is to continue weight management plan. He has agreed to keeping a food journal and adhering to recommended goals of 1500-1600 calories and 100+ grams protein.  Behavioral Intervention  We discussed the following Behavioral Modification Strategies today: increasing lean protein intake to established goals, decreasing simple carbohydrates , increasing vegetables, increasing fiber rich foods, increasing water intake , work on tracking and journaling calories using tracking application, continue to work on implementation of reduced calorie nutritional plan, continue to practice mindfulness when eating, planning for success, and continue to work on maintaining a reduced calorie state, getting the recommended amount of protein, incorporating  whole foods, making healthy choices, staying well hydrated and practicing mindfulness when eating..  Additional resources provided today: NA  Recommended Physical Activity Goals  Brad Barrera has been advised to work up to 150 minutes of moderate intensity aerobic activity a week and strengthening exercises 2-3 times per week for cardiovascular health, weight loss maintenance and preservation of muscle mass.   He has agreed to Think about enjoyable ways to increase daily physical activity and overcoming barriers to exercise, Increase physical activity in their day  and reduce sedentary time (increase NEAT)., and Work on scheduling and tracking physical activity.    Pharmacotherapy We discussed various medication options to help Brad Barrera with his weight loss efforts and we both agreed to consider Zepbound.  Will restart Victoza 0.6mg  off label for weight loss after seeing urology.  ASSOCIATED CONDITIONS ADDRESSED TODAY  Action/Plan  Other hyperlipidemia Cardiovascular risk and specific lipid/LDL goals reviewed.  We discussed several lifestyle modifications today and Orlin will continue to work on diet, exercise and weight loss efforts. Orders and follow up as documented in patient record.   Counseling Intensive lifestyle modifications are the first line treatment for this issue. Dietary changes: Increase soluble fiber. Decrease simple carbohydrates. Exercise changes: Moderate to vigorous-intensity aerobic activity 150 minutes per week if tolerated. Lipid-lowering medications: see documented in medical record.   To follow up with PCP  Generalized obesity  BMI 35.0-35.9,adult 08/05/23 Stop Victoza and start Zepbound 2.5mg .  side effects discussed.      Started Vit D 2,000 international units daily after last labs.   Labs reviewed in chart with patient from 07/20/23   Return in about 4 weeks (around 08/26/2023).Marland Kitchen He was informed of the importance of frequent follow up visits to maximize his  success with intensive lifestyle modifications for his multiple health conditions.   ATTESTASTION STATEMENTS:  Reviewed by clinician on day of visit: allergies, medications, problem list, medical history, surgical history, family history, social history, and previous encounter notes.   Time spent on visit including pre-visit chart review and post-visit care and charting was 30 minutes.    Theodis Sato. Mickayla Trouten FNP-C

## 2023-08-03 ENCOUNTER — Encounter: Payer: Self-pay | Admitting: Nurse Practitioner

## 2023-08-05 ENCOUNTER — Telehealth: Payer: Self-pay

## 2023-08-05 ENCOUNTER — Other Ambulatory Visit (HOSPITAL_BASED_OUTPATIENT_CLINIC_OR_DEPARTMENT_OTHER): Payer: Self-pay

## 2023-08-05 MED ORDER — TIRZEPATIDE-WEIGHT MANAGEMENT 2.5 MG/0.5ML ~~LOC~~ SOAJ
2.5000 mg | SUBCUTANEOUS | 0 refills | Status: DC
  Filled 2023-08-05: qty 2, 28d supply, fill #0

## 2023-08-05 NOTE — Telephone Encounter (Signed)
 PA submitted through [employer] Med[employer] for Zepbound. Awaiting insurance determination. Key: [id number]

## 2023-08-05 NOTE — Addendum Note (Signed)
 Addended by: Irene Limbo on: 08/05/2023 08:12 AM   Modules accepted: Orders

## 2023-08-05 NOTE — Telephone Encounter (Signed)
 Received approval through Cover My Meds for Zepbound. Approved from 08/05/2023 - 02/05/2024

## 2023-08-23 ENCOUNTER — Other Ambulatory Visit (HOSPITAL_BASED_OUTPATIENT_CLINIC_OR_DEPARTMENT_OTHER): Payer: Self-pay

## 2023-08-23 ENCOUNTER — Encounter: Payer: Self-pay | Admitting: Nurse Practitioner

## 2023-08-23 ENCOUNTER — Other Ambulatory Visit: Payer: Self-pay | Admitting: Nurse Practitioner

## 2023-08-23 ENCOUNTER — Other Ambulatory Visit: Payer: Self-pay

## 2023-08-23 DIAGNOSIS — E669 Obesity, unspecified: Secondary | ICD-10-CM

## 2023-08-23 MED ORDER — ZEPBOUND 5 MG/0.5ML ~~LOC~~ SOAJ
5.0000 mg | SUBCUTANEOUS | 0 refills | Status: DC
  Filled 2023-08-23 – 2023-08-26 (×4): qty 2, 28d supply, fill #0

## 2023-08-25 ENCOUNTER — Other Ambulatory Visit (HOSPITAL_BASED_OUTPATIENT_CLINIC_OR_DEPARTMENT_OTHER): Payer: Self-pay

## 2023-08-26 ENCOUNTER — Other Ambulatory Visit (HOSPITAL_BASED_OUTPATIENT_CLINIC_OR_DEPARTMENT_OTHER): Payer: Self-pay

## 2023-08-27 ENCOUNTER — Other Ambulatory Visit (HOSPITAL_BASED_OUTPATIENT_CLINIC_OR_DEPARTMENT_OTHER): Payer: Self-pay

## 2023-08-27 MED ORDER — POTASSIUM CITRATE ER 15 MEQ (1620 MG) PO TBCR
1.0000 | EXTENDED_RELEASE_TABLET | Freq: Two times a day (BID) | ORAL | 3 refills | Status: AC
  Filled 2023-08-27: qty 60, 30d supply, fill #0
  Filled 2023-09-24: qty 60, 30d supply, fill #1
  Filled 2024-02-05: qty 60, 30d supply, fill #2
  Filled 2024-03-14: qty 60, 30d supply, fill #3
  Filled 2024-05-29: qty 60, 30d supply, fill #4
  Filled 2024-07-05: qty 60, 30d supply, fill #5

## 2023-09-16 ENCOUNTER — Other Ambulatory Visit (HOSPITAL_BASED_OUTPATIENT_CLINIC_OR_DEPARTMENT_OTHER): Payer: Self-pay

## 2023-09-16 ENCOUNTER — Ambulatory Visit (INDEPENDENT_AMBULATORY_CARE_PROVIDER_SITE_OTHER): Payer: 59 | Admitting: Nurse Practitioner

## 2023-09-16 ENCOUNTER — Encounter: Payer: Self-pay | Admitting: Nurse Practitioner

## 2023-09-16 VITALS — BP 145/97 | HR 78 | Temp 98.3°F | Ht 63.0 in | Wt 193.0 lb

## 2023-09-16 DIAGNOSIS — E669 Obesity, unspecified: Secondary | ICD-10-CM

## 2023-09-16 DIAGNOSIS — Z6834 Body mass index (BMI) 34.0-34.9, adult: Secondary | ICD-10-CM | POA: Diagnosis not present

## 2023-09-16 DIAGNOSIS — R632 Polyphagia: Secondary | ICD-10-CM

## 2023-09-16 MED ORDER — ZEPBOUND 5 MG/0.5ML ~~LOC~~ SOAJ
5.0000 mg | SUBCUTANEOUS | 3 refills | Status: DC
  Filled 2023-09-16 – 2023-09-18 (×2): qty 2, 28d supply, fill #0

## 2023-09-16 NOTE — Progress Notes (Signed)
 Office: [phone]  /  Fax: [phone]  WEIGHT SUMMARY AND BIOMETRICS  Weight Lost Since Last Visit: 10lb  Weight Gained Since Last Visit: 0lb   Vitals Temp: 98.3 F (36.8 C) BP: (!) 145/97 Pulse Rate: 78 SpO2: 97 %   Anthropometric Measurements Height: 5\' 3"  (1.6 m) Weight: 193 lb (87.5 kg) BMI (Calculated): 34.2 Weight at Last Visit: 203lb Weight Lost Since Last Visit: 10lb Weight Gained Since Last Visit: 0lb Starting Weight: 175lb Total Weight Loss (lbs): 0 lb (0 kg)   Body Composition  Body Fat %: 23.5 % Fat Mass (lbs): 45.4 lbs Muscle Mass (lbs): 140.2 lbs Total Body Water (lbs): 96.2 lbs Visceral Fat Rating : 14   Other Clinical Data Fasting: No Labs: No Today's Visit #: 13 Starting Date: 01/12/22     HPI  Chief Complaint: OBESITY  Becky is here to discuss his progress with his obesity treatment plan. He is on the keeping a food journal and adhering to recommended goals of 1600-1700 calories and 100+ protein and states he is following his eating plan approximately 75 % of the time. He states he is exercising 0 minutes 0 days per week.   Interval History:  Since last office visit he has lost 10 pounds.  He is focusing on eating more protein.  He is drinking water daily.   He plans to start going back to the gym.     He is going to the beach in May and on a cruise in August  Pharmacotherapy for weight loss: He is currently taking Zepbound 5mg .  Denies side effects. Has helped with polyphagia and cravings.    Zepbound was approved from 08/05/23-02/05/24.    Previous pharmacotherapy for medical weight loss:  Previously tried Qsymia, phentermine, Wegovy, Victoza, lomaira and Saxenda in the past for medical weight loss.    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 His highest weight prior to surgery was 300 lbs and his nadir weight after surgery was 159 lbs.      PHYSICAL EXAM:  Blood pressure (!)  145/97, pulse 78, temperature 98.3 F (36.8 C), height 5\' 3"  (1.6 m), weight 193 lb (87.5 kg), SpO2 97%. Body mass index is 34.19 kg/m.  General: He is overweight, cooperative, alert, well developed, and in no acute distress. PSYCH: Has normal mood, affect and thought process.   Extremities: No edema.  Neurologic: No gross sensory or motor deficits. No tremors or fasciculations noted.    DIAGNOSTIC DATA REVIEWED:  BMET    Component Value Date/Time   NA 138 07/20/2023 1618   NA 139 06/29/2023 0818   K 3.7 07/20/2023 1618   CL 104 07/20/2023 1618   CO2 26 07/20/2023 1618   GLUCOSE 123 (H) 07/20/2023 1618   BUN 20 07/20/2023 1618   BUN 14 06/29/2023 0818   CREATININE 0.95 07/20/2023 1618   CALCIUM 9.4 07/20/2023 1618   GFRNONAA >60 07/20/2023 1618   GFRAA >90 11/08/2011 1014   Lab Results  Component Value Date   HGBA1C 4.8 01/12/2022   HGBA1C 5.1 12/28/2008   Lab Results  Component Value Date   INSULIN 3.2 01/12/2022   Lab Results  Component Value Date   TSH 2.130 06/29/2023   CBC    Component Value Date/Time   WBC 6.8 07/20/2023 1618   RBC 5.21 07/20/2023 1618   HGB 15.7 07/20/2023 1618   HGB 16.0 06/29/2023 0818   HCT 45.2 07/20/2023 1618  HCT 48.6 06/29/2023 0818   PLT 257 07/20/2023 1618   PLT 234 06/29/2023 0818   MCV 86.8 07/20/2023 1618   MCV 91 06/29/2023 0818   MCH 30.1 07/20/2023 1618   MCHC 34.7 07/20/2023 1618   RDW 12.6 07/20/2023 1618   RDW 12.4 06/29/2023 0818   Iron Studies    Component Value Date/Time   FERRITIN 124 06/29/2023 0818   Lipid Panel     Component Value Date/Time   CHOL 200 (H) 06/29/2023 0818   TRIG 143 06/29/2023 0818   HDL 56 06/29/2023 0818   CHOLHDL 4 12/20/2013 0839   VLDL 31.6 12/20/2013 0839   LDLCALC 119 (H) 06/29/2023 0818   Hepatic Function Panel     Component Value Date/Time   PROT 7.6 07/20/2023 1618   PROT 7.0 06/29/2023 0818   ALBUMIN 4.3 07/20/2023 1618   ALBUMIN 4.4 06/29/2023 0818   AST 25  07/20/2023 1618   ALT 28 07/20/2023 1618   ALKPHOS 50 07/20/2023 1618   BILITOT 0.7 07/20/2023 1618   BILITOT 0.7 06/29/2023 0818   BILIDIR <0.1 07/20/2023 1618   IBILI NOT CALCULATED 07/20/2023 1618      Component Value Date/Time   TSH 2.130 06/29/2023 0818   Nutritional Lab Results  Component Value Date   VD25OH 35.0 06/29/2023   VD25OH 109.0 (H) 01/12/2022     ASSESSMENT AND PLAN  TREATMENT PLAN FOR OBESITY:  Recommended Dietary Goals  Brad Barrera is currently in the action stage of change. As such, his goal is to continue weight management plan. He has agreed to keeping a food journal and adhering to recommended goals of 1600 calories and 100+  protein.  Behavioral Intervention  We discussed the following Behavioral Modification Strategies today: increasing lean protein intake to established goals, decreasing simple carbohydrates , increasing vegetables, increasing fiber rich foods, increasing water intake , reading food labels , keeping healthy foods at home, work on managing stress, creating time for self-care and relaxation, and continue to work on maintaining a reduced calorie state, getting the recommended amount of protein, incorporating whole foods, making healthy choices, staying well hydrated and practicing mindfulness when eating..  Additional resources provided today: NA  Recommended Physical Activity Goals  Aloysious has been advised to work up to 150 minutes of moderate intensity aerobic activity a week and strengthening exercises 2-3 times per week for cardiovascular health, weight loss maintenance and preservation of muscle mass.   He has agreed to Think about enjoyable ways to increase daily physical activity and overcoming barriers to exercise, Increase physical activity in their day and reduce sedentary time (increase NEAT)., and Work on scheduling and tracking physical activity.    Pharmacotherapy We discussed various medication options to help Neziah with  his weight loss efforts and we both agreed to continue Zepbound 5mg .  Side effects discussed.  ASSOCIATED CONDITIONS ADDRESSED TODAY  Action/Plan  Polyphagia -     Zepbound; Inject 5 mg into the skin once a week.  Dispense: 2 mL; Refill: 3  Generalized obesity -     Zepbound; Inject 5 mg into the skin once a week.  Dispense: 2 mL; Refill: 3      BP is elevated today and was elevated on the last visit.  He is scheduled to see his PCP in May.    Return in about 3 months (around 12/16/2023).Marland Kitchen He was informed of the importance of frequent follow up visits to maximize his success with intensive lifestyle modifications for his multiple health conditions.  ATTESTASTION STATEMENTS:  Reviewed by clinician on day of visit: allergies, medications, problem list, medical history, surgical history, family history, social history, and previous encounter notes.    Crist Dominion. Caralee Morea FNP-C

## 2023-09-17 ENCOUNTER — Other Ambulatory Visit (HOSPITAL_BASED_OUTPATIENT_CLINIC_OR_DEPARTMENT_OTHER): Payer: Self-pay

## 2023-09-18 ENCOUNTER — Other Ambulatory Visit (HOSPITAL_BASED_OUTPATIENT_CLINIC_OR_DEPARTMENT_OTHER): Payer: Self-pay

## 2023-09-20 ENCOUNTER — Other Ambulatory Visit (HOSPITAL_BASED_OUTPATIENT_CLINIC_OR_DEPARTMENT_OTHER): Payer: Self-pay

## 2023-09-21 ENCOUNTER — Telehealth: Payer: Self-pay | Admitting: Nurse Practitioner

## 2023-09-21 DIAGNOSIS — E669 Obesity, unspecified: Secondary | ICD-10-CM

## 2023-09-21 DIAGNOSIS — R632 Polyphagia: Secondary | ICD-10-CM

## 2023-09-21 MED ORDER — ZEPBOUND 5 MG/0.5ML ~~LOC~~ SOAJ: 5.0000 mg | SUBCUTANEOUS | 3 refills | Status: DC

## 2023-09-21 NOTE — Telephone Encounter (Signed)
 Calling because the Zepbound  Rx need to be sent to [facility]. Insurance will not cover Rx at the current pharmacy because they consider it a maintenance drug.

## 2023-09-21 NOTE — Telephone Encounter (Signed)
 Sent in

## 2023-10-18 ENCOUNTER — Other Ambulatory Visit: Payer: Self-pay | Admitting: Nurse Practitioner

## 2023-10-18 DIAGNOSIS — Z6834 Body mass index (BMI) 34.0-34.9, adult: Secondary | ICD-10-CM

## 2023-10-18 MED ORDER — ZEPBOUND 7.5 MG/0.5ML ~~LOC~~ SOAJ: 7.5000 mg | SUBCUTANEOUS | 0 refills | Status: DC

## 2023-11-08 ENCOUNTER — Other Ambulatory Visit (HOSPITAL_BASED_OUTPATIENT_CLINIC_OR_DEPARTMENT_OTHER): Payer: Self-pay

## 2023-11-08 MED ORDER — VALACYCLOVIR HCL 1 G PO TABS
1000.0000 mg | ORAL_TABLET | Freq: Two times a day (BID) | ORAL | 11 refills | Status: AC
  Filled 2023-11-08: qty 40, 20d supply, fill #0

## 2023-11-08 MED ORDER — HYDROCHLOROTHIAZIDE 25 MG PO TABS
25.0000 mg | ORAL_TABLET | Freq: Every day | ORAL | 2 refills | Status: DC
  Filled 2023-11-08: qty 30, 30d supply, fill #0
  Filled 2023-12-10: qty 30, 30d supply, fill #1
  Filled 2024-01-11: qty 30, 30d supply, fill #2

## 2023-11-17 ENCOUNTER — Other Ambulatory Visit: Payer: Self-pay | Admitting: Nurse Practitioner

## 2023-11-17 ENCOUNTER — Encounter: Payer: Self-pay | Admitting: Nurse Practitioner

## 2023-11-17 DIAGNOSIS — Z6834 Body mass index (BMI) 34.0-34.9, adult: Secondary | ICD-10-CM

## 2023-11-17 MED ORDER — ZEPBOUND 7.5 MG/0.5ML ~~LOC~~ SOAJ
7.5000 mg | SUBCUTANEOUS | 0 refills | Status: DC
Start: 2023-11-17 — End: 2023-12-15

## 2023-12-01 ENCOUNTER — Encounter: Payer: Self-pay | Admitting: Nurse Practitioner

## 2023-12-01 ENCOUNTER — Ambulatory Visit: Admitting: Nurse Practitioner

## 2023-12-01 VITALS — BP 134/89 | HR 78 | Temp 98.2°F | Ht 63.0 in | Wt 188.0 lb

## 2023-12-01 DIAGNOSIS — Z6833 Body mass index (BMI) 33.0-33.9, adult: Secondary | ICD-10-CM | POA: Diagnosis not present

## 2023-12-01 DIAGNOSIS — E66811 Obesity, class 1: Secondary | ICD-10-CM

## 2023-12-01 DIAGNOSIS — R632 Polyphagia: Secondary | ICD-10-CM | POA: Diagnosis not present

## 2023-12-01 NOTE — Progress Notes (Signed)
 Office: [phone]  /  Fax: [phone]  WEIGHT SUMMARY AND BIOMETRICS  Weight Lost Since Last Visit: 5lb  Weight Gained Since Last Visit: 0lb   Vitals Temp: 98.2 F (36.8 C) BP: 134/89 Pulse Rate: 78 SpO2: 97 %   Anthropometric Measurements Height: 5' 3 (1.6 m) Weight: 188 lb (85.3 kg) BMI (Calculated): 33.31 Weight at Last Visit: 193lb Weight Lost Since Last Visit: 5lb Weight Gained Since Last Visit: 0lb Starting Weight: 175lb Total Weight Loss (lbs): 0 lb (0 kg)   Body Composition  Body Fat %: 24.5 % Fat Mass (lbs): 46 lbs Muscle Mass (lbs): 135 lbs Total Body Water (lbs): 91.2 lbs Visceral Fat Rating : 14   Other Clinical Data Fasting: Yes Labs: No Today's Visit #: 14 Starting Date: 01/12/22     HPI  Chief Complaint: OBESITY  Brad Barrera is here to discuss his progress with his obesity treatment plan. He is on the keeping a food journal and adhering to recommended goals of 1600-1700 calories and 100+ protein and states he is following his eating plan approximately 80 % of the time. He states he is exercising 0 minutes 0 days per week.   Interval History:  Since last office visit he has lost 5 pounds.  He is trying to be mindful of what he is eating.  He is trying to eat a protein with each meal.  He is drinking water and coffee daily.  He is not currently exercise due to his work schedule.  He is working 2 jobs and on the weekends.  He is going on a cruise in August.  His highest weight was 300lbs  Pharmacotherapy for weight loss: He is currently taking Zepbound  7.5mg .  Denies side effects. Has continued to help with polyphagia and cravings.     Zepbound  was approved from 08/05/23-02/05/24.    Previous pharmacotherapy for medical weight loss:  Previously tried Qsymia, phentermine , Wegovy , Victoza , Lomaira  and Saxenda  in the past for medical weight loss.   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 His  highest weight prior to surgery was 300 lbs and his nadir weight after surgery was 159 lbs.    PHYSICAL EXAM:  Blood pressure 134/89, pulse 78, temperature 98.2 F (36.8 C), height 5' 3 (1.6 m), weight 188 lb (85.3 kg), SpO2 97%. Body mass index is 33.3 kg/m.  General: He is overweight, cooperative, alert, well developed, and in no acute distress. PSYCH: Has normal mood, affect and thought process.   Extremities: No edema.  Neurologic: No gross sensory or motor deficits. No tremors or fasciculations noted.    DIAGNOSTIC DATA REVIEWED:  BMET    Component Value Date/Time   NA 138 07/20/2023 1618   NA 139 06/29/2023 0818   K 3.7 07/20/2023 1618   CL 104 07/20/2023 1618   CO2 26 07/20/2023 1618   GLUCOSE 123 (H) 07/20/2023 1618   BUN 20 07/20/2023 1618   BUN 14 06/29/2023 0818   CREATININE 0.95 07/20/2023 1618   CALCIUM 9.4 07/20/2023 1618   GFRNONAA >60 07/20/2023 1618   GFRAA >90 11/08/2011 1014   Lab Results  Component Value Date   HGBA1C 4.8 01/12/2022   HGBA1C 5.1 12/28/2008   Lab Results  Component Value Date   INSULIN  3.2 01/12/2022   Lab Results  Component Value Date   TSH 2.130 06/29/2023   CBC    Component Value Date/Time   WBC 6.8 07/20/2023 1618   RBC  5.21 07/20/2023 1618   HGB 15.7 07/20/2023 1618   HGB 16.0 06/29/2023 0818   HCT 45.2 07/20/2023 1618   HCT 48.6 06/29/2023 0818   PLT 257 07/20/2023 1618   PLT 234 06/29/2023 0818   MCV 86.8 07/20/2023 1618   MCV 91 06/29/2023 0818   MCH 30.1 07/20/2023 1618   MCHC 34.7 07/20/2023 1618   RDW 12.6 07/20/2023 1618   RDW 12.4 06/29/2023 0818   Iron Studies    Component Value Date/Time   FERRITIN 124 06/29/2023 0818   Lipid Panel     Component Value Date/Time   CHOL 200 (H) 06/29/2023 0818   TRIG 143 06/29/2023 0818   HDL 56 06/29/2023 0818   CHOLHDL 4 12/20/2013 0839   VLDL 31.6 12/20/2013 0839   LDLCALC 119 (H) 06/29/2023 0818   Hepatic Function Panel     Component Value Date/Time    PROT 7.6 07/20/2023 1618   PROT 7.0 06/29/2023 0818   ALBUMIN 4.3 07/20/2023 1618   ALBUMIN 4.4 06/29/2023 0818   AST 25 07/20/2023 1618   ALT 28 07/20/2023 1618   ALKPHOS 50 07/20/2023 1618   BILITOT 0.7 07/20/2023 1618   BILITOT 0.7 06/29/2023 0818   BILIDIR <0.1 07/20/2023 1618   IBILI NOT CALCULATED 07/20/2023 1618      Component Value Date/Time   TSH 2.130 06/29/2023 0818   Nutritional Lab Results  Component Value Date   VD25OH 35.0 06/29/2023   VD25OH 109.0 (H) 01/12/2022     ASSESSMENT AND PLAN  TREATMENT PLAN FOR OBESITY:  Recommended Dietary Goals  Caysen is currently in the action stage of change. As such, his goal is to continue weight management plan. He has agreed to keeping a food journal and adhering to recommended goals of 1600-1700 calories and 100+ grams protein.  I have discussed the importance with him of increasing protein, meeting his protein goals and adding in resistance training.  Behavioral Intervention  We discussed the following Behavioral Modification Strategies today: increasing lean protein intake to established goals, decreasing simple carbohydrates , increasing vegetables, increasing fiber rich foods, increasing water intake , work on meal planning and preparation, work on tracking and journaling calories using tracking application, reading food labels , and continue to work on maintaining a reduced calorie state, getting the recommended amount of protein, incorporating whole foods, making healthy choices, staying well hydrated and practicing mindfulness when eating..  Additional resources provided today: NA  Recommended Physical Activity Goals  Melody has been advised to work up to 150 minutes of moderate intensity aerobic activity a week and strengthening exercises 2-3 times per week for cardiovascular health, weight loss maintenance and preservation of muscle mass.   He has agreed to Think about enjoyable ways to increase daily  physical activity and overcoming barriers to exercise, Increase physical activity in their day and reduce sedentary time (increase NEAT)., and Work on scheduling and tracking physical activity.    Pharmacotherapy We discussed various medication options to help Elior with his weight loss efforts and we both agreed to continue Zepbound  7.5mg .  Side effects discussed.  He has not had a 5 pound muscle mass loss and an increase in body fat percentage.  Due to that I am not can increase his Zepbound  at this time.  I would like him to come in for a weight check to evaluate his body fat percentage and muscle mass in pounds.  After that we will discuss the next best plan of care.  ASSOCIATED CONDITIONS ADDRESSED  TODAY  Action/Plan  Polyphagia Intensive lifestyle modifications are the first line treatment for this issue. We discussed several lifestyle modifications today and he will continue to work on diet, exercise and weight loss efforts. Orders and follow up as documented in patient record.  Counseling Polyphagia is excessive hunger. Causes can include: low blood sugars, hypERthyroidism, PMS, lack of sleep, stress, insulin  resistance, diabetes, certain medications, and diets that are deficient in protein and fiber.    Obesity, Class I, BMI 30-34.9 Continue Zepbound  7.5 mg.  Side effects discussed.    His BP looks better today.  Will continue to monitor.  He is scheduled for labs July 14th.     Return in about 4 weeks (around 12/29/2023).SABRA He was informed of the importance of frequent follow up visits to maximize his success with intensive lifestyle modifications for his multiple health conditions.   ATTESTASTION STATEMENTS:  Reviewed by clinician on day of visit: allergies, medications, problem list, medical history, surgical history, family history, social history, and previous encounter notes.   Time spent on visit including pre-visit chart review and post-visit care and charting was 30  minutes.    Corean SAUNDERS. Yanni Ruberg FNP-C

## 2023-12-10 ENCOUNTER — Other Ambulatory Visit (HOSPITAL_BASED_OUTPATIENT_CLINIC_OR_DEPARTMENT_OTHER): Payer: Self-pay

## 2023-12-10 MED ORDER — ROSUVASTATIN CALCIUM 10 MG PO TABS
10.0000 mg | ORAL_TABLET | Freq: Every day | ORAL | 5 refills | Status: DC
  Filled 2023-12-10: qty 30, 30d supply, fill #0

## 2023-12-10 MED ORDER — LOSARTAN POTASSIUM 50 MG PO TABS
50.0000 mg | ORAL_TABLET | Freq: Every day | ORAL | 5 refills | Status: DC
  Filled 2023-12-10: qty 30, 30d supply, fill #0
  Filled 2024-01-11: qty 30, 30d supply, fill #1
  Filled 2024-02-05: qty 30, 30d supply, fill #2
  Filled 2024-03-14: qty 30, 30d supply, fill #3
  Filled 2024-04-09: qty 30, 30d supply, fill #4
  Filled 2024-05-15: qty 30, 30d supply, fill #5

## 2023-12-14 ENCOUNTER — Encounter: Payer: Self-pay | Admitting: Nurse Practitioner

## 2023-12-15 ENCOUNTER — Other Ambulatory Visit: Payer: Self-pay | Admitting: Nurse Practitioner

## 2023-12-15 DIAGNOSIS — Z6834 Body mass index (BMI) 34.0-34.9, adult: Secondary | ICD-10-CM

## 2023-12-15 MED ORDER — ZEPBOUND 7.5 MG/0.5ML ~~LOC~~ SOAJ: 7.5000 mg | SUBCUTANEOUS | 0 refills | Status: DC

## 2023-12-16 ENCOUNTER — Encounter: Payer: Self-pay | Admitting: Nurse Practitioner

## 2023-12-16 ENCOUNTER — Ambulatory Visit: Admitting: Nurse Practitioner

## 2023-12-21 ENCOUNTER — Other Ambulatory Visit (HOSPITAL_BASED_OUTPATIENT_CLINIC_OR_DEPARTMENT_OTHER): Payer: Self-pay

## 2023-12-21 MED ORDER — ROSUVASTATIN CALCIUM 20 MG PO TABS
20.0000 mg | ORAL_TABLET | Freq: Every day | ORAL | 1 refills | Status: AC
  Filled 2023-12-21: qty 30, 30d supply, fill #0
  Filled 2024-02-05: qty 30, 30d supply, fill #1
  Filled 2024-03-14: qty 30, 30d supply, fill #2
  Filled 2024-04-09: qty 30, 30d supply, fill #3
  Filled 2024-05-15: qty 30, 30d supply, fill #4

## 2024-01-11 ENCOUNTER — Other Ambulatory Visit (HOSPITAL_BASED_OUTPATIENT_CLINIC_OR_DEPARTMENT_OTHER): Payer: Self-pay

## 2024-01-11 MED ORDER — ASPIRIN 81 MG PO TBEC
81.0000 mg | DELAYED_RELEASE_TABLET | Freq: Every day | ORAL | 11 refills | Status: AC
  Filled 2024-01-11: qty 30, 30d supply, fill #0
  Filled 2024-02-05: qty 30, 30d supply, fill #1
  Filled 2024-04-09: qty 30, 30d supply, fill #2

## 2024-01-13 ENCOUNTER — Ambulatory Visit: Admitting: Nurse Practitioner

## 2024-01-18 ENCOUNTER — Encounter: Payer: Self-pay | Admitting: Nurse Practitioner

## 2024-01-20 ENCOUNTER — Other Ambulatory Visit: Payer: Self-pay | Admitting: Nurse Practitioner

## 2024-01-20 DIAGNOSIS — E66811 Obesity, class 1: Secondary | ICD-10-CM

## 2024-01-20 MED ORDER — WEGOVY 1 MG/0.5ML ~~LOC~~ SOAJ: 1.0000 mg | SUBCUTANEOUS | 0 refills | Status: DC

## 2024-01-29 ENCOUNTER — Other Ambulatory Visit (HOSPITAL_COMMUNITY): Payer: Self-pay

## 2024-02-02 ENCOUNTER — Other Ambulatory Visit (HOSPITAL_BASED_OUTPATIENT_CLINIC_OR_DEPARTMENT_OTHER): Payer: Self-pay

## 2024-02-02 MED ORDER — ALBUTEROL SULFATE HFA 108 (90 BASE) MCG/ACT IN AERS
INHALATION_SPRAY | RESPIRATORY_TRACT | 0 refills | Status: AC
  Filled 2024-02-02: qty 6.7, 25d supply, fill #0

## 2024-02-03 ENCOUNTER — Ambulatory Visit (INDEPENDENT_AMBULATORY_CARE_PROVIDER_SITE_OTHER): Admitting: Nurse Practitioner

## 2024-02-03 ENCOUNTER — Encounter: Payer: Self-pay | Admitting: Nurse Practitioner

## 2024-02-03 VITALS — BP 121/83 | HR 73 | Temp 98.5°F | Ht 63.0 in | Wt 192.0 lb

## 2024-02-03 DIAGNOSIS — Z6834 Body mass index (BMI) 34.0-34.9, adult: Secondary | ICD-10-CM | POA: Diagnosis not present

## 2024-02-03 DIAGNOSIS — E66811 Obesity, class 1: Secondary | ICD-10-CM | POA: Diagnosis not present

## 2024-02-03 DIAGNOSIS — E7849 Other hyperlipidemia: Secondary | ICD-10-CM

## 2024-02-03 DIAGNOSIS — R632 Polyphagia: Secondary | ICD-10-CM

## 2024-02-03 MED ORDER — WEGOVY 1.7 MG/0.75ML ~~LOC~~ SOAJ: 1.7000 mg | SUBCUTANEOUS | 0 refills | Status: DC

## 2024-02-03 NOTE — Progress Notes (Signed)
 Office: [phone]  /  Fax: [phone]  WEIGHT SUMMARY AND BIOMETRICS  Weight Lost Since Last Visit: 0lb  Weight Gained Since Last Visit: 4lb   Vitals Temp: 98.5 F (36.9 C) BP: 121/83 Pulse Rate: 73 SpO2: 99 %   Anthropometric Measurements Height: 5' 3 (1.6 m) Weight: 192 lb (87.1 kg) BMI (Calculated): 34.02 Weight at Last Visit: 188lb Weight Lost Since Last Visit: 0lb Weight Gained Since Last Visit: 4lb Starting Weight: 175lb Total Weight Loss (lbs): 0 lb (0 kg)   Body Composition  Body Fat %: 24.8 % Fat Mass (lbs): 47.6 lbs Muscle Mass (lbs): 137.2 lbs Total Body Water (lbs): 93.8 lbs Visceral Fat Rating : 14   Other Clinical Data Fasting: Yes Labs: No Today's Visit #: 15 Starting Date: 01/12/22     HPI  Chief Complaint: OBESITY  Brad Barrera is here to discuss his progress with his obesity treatment plan. He is on the keeping a food journal and adhering to recommended goals of 1500-1600 calories and 100+ protein and states he is following his eating plan approximately 90-100 % of the time. He states he is exercising 0 minutes 0 days per week.   Interval History:  Since last office visit he has gained 4 pounds.  He recently returned from a cruise.  He recently switched from Zepbound  to Wegovy  1mg . Four weeks ago he weighed 202lbs and today he was 192 lbs.  He overall feels that the Wegovy  works better for him.  He feels that the Wegovy  is helping better polyphagia and cravings. Also reports less side effects since switching to Wegovy .  Notes he has more energy and plans to start exercising now that he feels better.    His highest weight was 300lbs   Pharmacotherapy for weight loss: He is currently taking Wegovy  1mg .  Denies side effects.     Previous pharmacotherapy for medical weight loss:  Previously tried Qsymia, phentermine , Wegovy , Victoza , Lomaira , Zepbound  and Saxenda  in the past for medical weight loss.    Bariatric surgery:   Patient is  status post Sleeve gastrectomy by Dr. Dela in Oct 2017. His highest weight prior to surgery was 300 lbs and his nadir weight after surgery was 159 lbs. He is taking a MVI, Vit D 2,000 international units  and is not taking Vit B12.    Hyperlipidemia Medication(s): Crestor  20mg -increased in July. Denies side effects.  Had a cardiac cath 01/11/24.  Lab Results  Component Value Date   CHOL 200 (H) 06/29/2023   HDL 56 06/29/2023   LDLCALC 119 (H) 06/29/2023   TRIG 143 06/29/2023   CHOLHDL 4 12/20/2013   Lab Results  Component Value Date   ALT 28 07/20/2023   AST 25 07/20/2023   ALKPHOS 50 07/20/2023   BILITOT 0.7 07/20/2023   The 10-year ASCVD risk score (Arnett DK, et al., 2019) is: 4.7%   Values used to calculate the score:     Age: 53 years     Clincally relevant sex: Male     Is Non-Hispanic African American: No     Diabetic: No     Tobacco smoker: No     Systolic Blood Pressure: 121 mmHg     Is BP treated: Yes     HDL Cholesterol: 45 mg/dL     Total Cholesterol: 150 mg/dL      PHYSICAL EXAM:  Blood pressure 121/83, pulse 73, temperature 98.5 F (36.9 C), height 5' 3 (1.6 m), weight 192 lb (87.1 kg), SpO2 99%.  Body mass index is 34.01 kg/m.  General: He is overweight, cooperative, alert, well developed, and in no acute distress. PSYCH: Has normal mood, affect and thought process.   Extremities: No edema.  Neurologic: No gross sensory or motor deficits. No tremors or fasciculations noted.    DIAGNOSTIC DATA REVIEWED:  BMET    Component Value Date/Time   NA 138 07/20/2023 1618   NA 139 06/29/2023 0818   K 3.7 07/20/2023 1618   CL 104 07/20/2023 1618   CO2 26 07/20/2023 1618   GLUCOSE 123 (H) 07/20/2023 1618   BUN 20 07/20/2023 1618   BUN 14 06/29/2023 0818   CREATININE 0.95 07/20/2023 1618   CALCIUM  9.4 07/20/2023 1618   GFRNONAA >60 07/20/2023 1618   GFRAA >90 11/08/2011 1014   Lab Results  Component Value Date   HGBA1C 4.8 01/12/2022   HGBA1C 5.1  12/28/2008   Lab Results  Component Value Date   INSULIN  3.2 01/12/2022   Lab Results  Component Value Date   TSH 2.130 06/29/2023   CBC    Component Value Date/Time   WBC 6.8 07/20/2023 1618   RBC 5.21 07/20/2023 1618   HGB 15.7 07/20/2023 1618   HGB 16.0 06/29/2023 0818   HCT 45.2 07/20/2023 1618   HCT 48.6 06/29/2023 0818   PLT 257 07/20/2023 1618   PLT 234 06/29/2023 0818   MCV 86.8 07/20/2023 1618   MCV 91 06/29/2023 0818   MCH 30.1 07/20/2023 1618   MCHC 34.7 07/20/2023 1618   RDW 12.6 07/20/2023 1618   RDW 12.4 06/29/2023 0818   Iron Studies    Component Value Date/Time   FERRITIN 124 06/29/2023 0818   Lipid Panel     Component Value Date/Time   CHOL 200 (H) 06/29/2023 0818   TRIG 143 06/29/2023 0818   HDL 56 06/29/2023 0818   CHOLHDL 4 12/20/2013 0839   VLDL 31.6 12/20/2013 0839   LDLCALC 119 (H) 06/29/2023 0818   Hepatic Function Panel     Component Value Date/Time   PROT 7.6 07/20/2023 1618   PROT 7.0 06/29/2023 0818   ALBUMIN 4.3 07/20/2023 1618   ALBUMIN 4.4 06/29/2023 0818   AST 25 07/20/2023 1618   ALT 28 07/20/2023 1618   ALKPHOS 50 07/20/2023 1618   BILITOT 0.7 07/20/2023 1618   BILITOT 0.7 06/29/2023 0818   BILIDIR <0.1 07/20/2023 1618   IBILI NOT CALCULATED 07/20/2023 1618      Component Value Date/Time   TSH 2.130 06/29/2023 0818   Nutritional Lab Results  Component Value Date   VD25OH 35.0 06/29/2023   VD25OH 109.0 (H) 01/12/2022     ASSESSMENT AND PLAN  TREATMENT PLAN FOR OBESITY:  Recommended Dietary Goals  Brad Barrera is currently in the action stage of change. As such, his goal is to continue weight management plan. He has agreed to keeping a food journal and adhering to recommended goals of 1500 calories and 100 + grams of protein.  Behavioral Intervention  We discussed the following Behavioral Modification Strategies today: increasing lean protein intake to established goals, decreasing simple carbohydrates ,  increasing vegetables, increasing fiber rich foods, increasing water intake , work on meal planning and preparation, work on tracking and journaling calories using tracking application, reading food labels , keeping healthy foods at home, continue to work on maintaining a reduced calorie state, getting the recommended amount of protein, incorporating whole foods, making healthy choices, staying well hydrated and practicing mindfulness when eating., and increase protein intake, fibrous foods (  25 grams per day for women, 30 grams for men) and water to improve satiety and decrease hunger signals. .  Additional resources provided today: NA  Recommended Physical Activity Goals  Brad Barrera has been advised to work up to 150 minutes of moderate intensity aerobic activity a week and strengthening exercises 2-3 times per week for cardiovascular health, weight loss maintenance and preservation of muscle mass.   He has agreed to Think about enjoyable ways to increase daily physical activity and overcoming barriers to exercise, Increase physical activity in their day and reduce sedentary time (increase NEAT)., Work on scheduling and tracking physical activity. , and Combine aerobic and strengthening exercises for efficiency and improved cardiometabolic health.   Pharmacotherapy We discussed various medication options to help Brad Barrera with his weight loss efforts and we both agreed to continue Wegovy  1mg  x 2 more doses and then increase Wegovy  1.7mg . Side effects discussed.    ASSOCIATED CONDITIONS ADDRESSED TODAY  Action/Plan  Other hyperlipidemia  Polyphagia -     Wegovy ; Inject 1.7 mg into the skin once a week.  Dispense: 3 mL; Refill: 0  Obesity, Class I, BMI 30-34.9 -     Wegovy ; Inject 1.7 mg into the skin once a week.  Dispense: 3 mL; Refill: 0         Return in about 5 weeks (around 03/09/2024).SABRA He was informed of the importance of frequent follow up visits to maximize his success with intensive  lifestyle modifications for his multiple health conditions.   ATTESTASTION STATEMENTS:  Reviewed by clinician on day of visit: allergies, medications, problem list, medical history, surgical history, family history, social history, and previous encounter notes.   Brad SAUNDERS. Bekki Tavenner FNP-C

## 2024-02-05 ENCOUNTER — Other Ambulatory Visit (HOSPITAL_BASED_OUTPATIENT_CLINIC_OR_DEPARTMENT_OTHER): Payer: Self-pay

## 2024-02-07 ENCOUNTER — Other Ambulatory Visit (HOSPITAL_BASED_OUTPATIENT_CLINIC_OR_DEPARTMENT_OTHER): Payer: Self-pay

## 2024-02-07 ENCOUNTER — Other Ambulatory Visit: Payer: Self-pay

## 2024-02-07 MED ORDER — HYDROCHLOROTHIAZIDE 25 MG PO TABS
25.0000 mg | ORAL_TABLET | Freq: Every day | ORAL | 2 refills | Status: AC
  Filled 2024-02-07: qty 30, 30d supply, fill #0
  Filled 2024-03-14: qty 30, 30d supply, fill #1

## 2024-02-08 ENCOUNTER — Other Ambulatory Visit (HOSPITAL_BASED_OUTPATIENT_CLINIC_OR_DEPARTMENT_OTHER): Payer: Self-pay

## 2024-02-08 MED ORDER — HYDROCHLOROTHIAZIDE 25 MG PO TABS
25.0000 mg | ORAL_TABLET | Freq: Every day | ORAL | 2 refills | Status: AC
  Filled 2024-02-08 – 2024-04-09 (×2): qty 30, 30d supply, fill #0
  Filled 2024-05-15: qty 30, 30d supply, fill #1
  Filled 2024-07-05: qty 30, 30d supply, fill #2

## 2024-03-07 ENCOUNTER — Ambulatory Visit: Admitting: Nurse Practitioner

## 2024-03-07 ENCOUNTER — Encounter: Payer: Self-pay | Admitting: Nurse Practitioner

## 2024-03-07 VITALS — BP 111/80 | HR 72 | Temp 98.3°F | Ht 63.0 in | Wt 191.0 lb

## 2024-03-07 DIAGNOSIS — E66811 Obesity, class 1: Secondary | ICD-10-CM | POA: Diagnosis not present

## 2024-03-07 DIAGNOSIS — R632 Polyphagia: Secondary | ICD-10-CM | POA: Diagnosis not present

## 2024-03-07 DIAGNOSIS — Z6833 Body mass index (BMI) 33.0-33.9, adult: Secondary | ICD-10-CM | POA: Diagnosis not present

## 2024-03-07 MED ORDER — WEGOVY 2.4 MG/0.75ML ~~LOC~~ SOAJ: 2.4000 mg | SUBCUTANEOUS | 0 refills | Status: DC

## 2024-03-07 NOTE — Progress Notes (Signed)
 Office: [phone]  /  Fax: [phone]  WEIGHT SUMMARY AND BIOMETRICS  Weight Lost Since Last Visit: 1lb  Weight Gained Since Last Visit: 0lb   Vitals Temp: 98.3 F (36.8 C) BP: 111/80 Pulse Rate: 72 SpO2: 96 %   Anthropometric Measurements Height: 5' 3 (1.6 m) Weight: 191 lb (86.6 kg) BMI (Calculated): 33.84 Weight at Last Visit: 192lb Weight Lost Since Last Visit: 1lb Weight Gained Since Last Visit: 0lb Starting Weight: 175lb Total Weight Loss (lbs): 0 lb (0 kg)   Body Composition  Body Fat %: 24.1 % Fat Mass (lbs): 46.2 lbs Muscle Mass (lbs): 138.4 lbs Total Body Water (lbs): 93 lbs Visceral Fat Rating : 14   Other Clinical Data Fasting: Yes Labs: No Today's Visit #: 16 Starting Date: 01/12/22     HPI  Chief Complaint: OBESITY  Brad Barrera is here to discuss his progress with his obesity treatment plan. He is on the keeping a food journal and adhering to recommended goals of 2000 calories and 100+ protein and states he is following his eating plan approximately 50 % of the time. He states he is exercising 0 minutes 0 days per week.   Interval History:  Since last office visit he has lost 1 pound. He notes he has gotten off track over the past 2 weeks and has been eating more due to stress at work.  He is drinking water but not enough.  He is also drinking coffee, diet ginger ale and unsweetened tea.   He is not currently exercising and knows he needs to start back exercising.  He is going on a cruise next week.    His highest weight was 300lbs   Pharmacotherapy for weight loss: He is currently taking Wegovy  1.7 mg.  Denies side effects.  Notes polyphagia and cravings.      Previous pharmacotherapy for medical weight loss:  Previously tried Qsymia, phentermine , Wegovy , Victoza , Lomaira , Zepbound  and Saxenda  in the past for medical weight loss.    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 His highest  weight prior to surgery was 300 lbs and his nadir weight after surgery was 159 lbs. He is taking a MVI, Vit D 2,000 international units and is not taking Vit B12.       PHYSICAL EXAM:  Blood pressure 111/80, pulse 72, temperature 98.3 F (36.8 C), height 5' 3 (1.6 m), weight 191 lb (86.6 kg), SpO2 96%. Body mass index is 33.83 kg/m.  General: He is overweight, cooperative, alert, well developed, and in no acute distress. PSYCH: Has normal mood, affect and thought process.   Extremities: No edema.  Neurologic: No gross sensory or motor deficits. No tremors or fasciculations noted.    DIAGNOSTIC DATA REVIEWED:  BMET    Component Value Date/Time   NA 138 07/20/2023 1618   NA 139 06/29/2023 0818   K 3.7 07/20/2023 1618   CL 104 07/20/2023 1618   CO2 26 07/20/2023 1618   GLUCOSE 123 (H) 07/20/2023 1618   BUN 20 07/20/2023 1618   BUN 14 06/29/2023 0818   CREATININE 0.95 07/20/2023 1618   CALCIUM  9.4 07/20/2023 1618   GFRNONAA >60 07/20/2023 1618   GFRAA >90 11/08/2011 1014   Lab Results  Component Value Date   HGBA1C 4.8 01/12/2022   HGBA1C 5.1 12/28/2008   Lab Results  Component Value Date   INSULIN  3.2 01/12/2022   Lab Results  Component Value Date   TSH  2.130 06/29/2023   CBC    Component Value Date/Time   WBC 6.8 07/20/2023 1618   RBC 5.21 07/20/2023 1618   HGB 15.7 07/20/2023 1618   HGB 16.0 06/29/2023 0818   HCT 45.2 07/20/2023 1618   HCT 48.6 06/29/2023 0818   PLT 257 07/20/2023 1618   PLT 234 06/29/2023 0818   MCV 86.8 07/20/2023 1618   MCV 91 06/29/2023 0818   MCH 30.1 07/20/2023 1618   MCHC 34.7 07/20/2023 1618   RDW 12.6 07/20/2023 1618   RDW 12.4 06/29/2023 0818   Iron Studies    Component Value Date/Time   FERRITIN 124 06/29/2023 0818   Lipid Panel     Component Value Date/Time   CHOL 200 (H) 06/29/2023 0818   TRIG 143 06/29/2023 0818   HDL 56 06/29/2023 0818   CHOLHDL 4 12/20/2013 0839   VLDL 31.6 12/20/2013 0839   LDLCALC 119 (H)  06/29/2023 0818   Hepatic Function Panel     Component Value Date/Time   PROT 7.6 07/20/2023 1618   PROT 7.0 06/29/2023 0818   ALBUMIN 4.3 07/20/2023 1618   ALBUMIN 4.4 06/29/2023 0818   AST 25 07/20/2023 1618   ALT 28 07/20/2023 1618   ALKPHOS 50 07/20/2023 1618   BILITOT 0.7 07/20/2023 1618   BILITOT 0.7 06/29/2023 0818   BILIDIR <0.1 07/20/2023 1618   IBILI NOT CALCULATED 07/20/2023 1618      Component Value Date/Time   TSH 2.130 06/29/2023 0818   Nutritional Lab Results  Component Value Date   VD25OH 35.0 06/29/2023   VD25OH 109.0 (H) 01/12/2022     ASSESSMENT AND PLAN  TREATMENT PLAN FOR OBESITY:  Recommended Dietary Goals  Brad Barrera is currently in the action stage of change. As such, his goal is to continue weight management plan. He has agreed to keeping a food journal and adhering to recommended goals of 1500-1600 calories and 90+ grams of protein.  Behavioral Intervention  We discussed the following Behavioral Modification Strategies today: increasing lean protein intake to established goals, decreasing simple carbohydrates , increasing vegetables, increasing fiber rich foods, increasing water intake , work on meal planning and preparation, work on tracking and journaling calories using tracking application, reading food labels , keeping healthy foods at home, staying on track while traveling and vacationing, continue to work on maintaining a reduced calorie state, getting the recommended amount of protein, incorporating whole foods, making healthy choices, staying well hydrated and practicing mindfulness when eating., and increase protein intake, fibrous foods (25 grams per day for women, 30 grams for men) and water to improve satiety and decrease hunger signals. .  Additional resources provided today: NA  Recommended Physical Activity Goals  Brad Barrera has been advised to work up to 150 minutes of moderate intensity aerobic activity a week and strengthening  exercises 2-3 times per week for cardiovascular health, weight loss maintenance and preservation of muscle mass.   He has agreed to Think about enjoyable ways to increase daily physical activity and overcoming barriers to exercise, Increase physical activity in their day and reduce sedentary time (increase NEAT)., Work on scheduling and tracking physical activity. , and Combine aerobic and strengthening exercises for efficiency and improved cardiometabolic health.   Pharmacotherapy We discussed various medication options to help Shaune with his weight loss efforts and we both agreed to increase Wegovy  2.4mg .  side effects discussed.  ASSOCIATED CONDITIONS ADDRESSED TODAY  Action/Plan  Polyphagia -     Wegovy ; Inject 2.4 mg into the skin once  a week.  Dispense: 3 mL; Refill: 0  Obesity, Class I, BMI 30-34.9 -     Wegovy ; Inject 2.4 mg into the skin once a week.  Dispense: 3 mL; Refill: 0         Return in about 2 months (around 05/07/2024).SABRA He was informed of the importance of frequent follow up visits to maximize his success with intensive lifestyle modifications for his multiple health conditions.   ATTESTASTION STATEMENTS:  Reviewed by clinician on day of visit: allergies, medications, problem list, medical history, surgical history, family history, social history, and previous encounter notes.     Corean SAUNDERS. Mckynlie Vanderslice FNP-C

## 2024-03-14 ENCOUNTER — Other Ambulatory Visit (HOSPITAL_BASED_OUTPATIENT_CLINIC_OR_DEPARTMENT_OTHER): Payer: Self-pay

## 2024-04-10 ENCOUNTER — Other Ambulatory Visit (HOSPITAL_BASED_OUTPATIENT_CLINIC_OR_DEPARTMENT_OTHER): Payer: Self-pay

## 2024-04-10 ENCOUNTER — Other Ambulatory Visit: Payer: Self-pay | Admitting: Nurse Practitioner

## 2024-04-10 DIAGNOSIS — R632 Polyphagia: Secondary | ICD-10-CM

## 2024-04-10 DIAGNOSIS — E66811 Obesity, class 1: Secondary | ICD-10-CM

## 2024-04-10 MED ORDER — WEGOVY 2.4 MG/0.75ML ~~LOC~~ SOAJ: 2.4000 mg | SUBCUTANEOUS | 0 refills | Status: DC

## 2024-05-08 ENCOUNTER — Encounter: Payer: Self-pay | Admitting: Nurse Practitioner

## 2024-05-09 ENCOUNTER — Other Ambulatory Visit: Payer: Self-pay | Admitting: Nurse Practitioner

## 2024-05-09 DIAGNOSIS — R632 Polyphagia: Secondary | ICD-10-CM

## 2024-05-09 DIAGNOSIS — E66811 Obesity, class 1: Secondary | ICD-10-CM

## 2024-05-09 MED ORDER — WEGOVY 2.4 MG/0.75ML ~~LOC~~ SOAJ: 2.4000 mg | SUBCUTANEOUS | 0 refills | Status: DC

## 2024-05-15 ENCOUNTER — Other Ambulatory Visit (HOSPITAL_BASED_OUTPATIENT_CLINIC_OR_DEPARTMENT_OTHER): Payer: Self-pay

## 2024-05-16 ENCOUNTER — Encounter: Payer: Self-pay | Admitting: Nurse Practitioner

## 2024-05-16 ENCOUNTER — Ambulatory Visit: Admitting: Nurse Practitioner

## 2024-05-16 VITALS — BP 122/82 | HR 74 | Temp 98.6°F | Ht 63.0 in | Wt 194.0 lb

## 2024-05-16 DIAGNOSIS — E66811 Obesity, class 1: Secondary | ICD-10-CM

## 2024-05-16 DIAGNOSIS — R632 Polyphagia: Secondary | ICD-10-CM

## 2024-05-16 MED ORDER — WEGOVY 2.4 MG/0.75ML ~~LOC~~ SOAJ: 2.4000 mg | SUBCUTANEOUS | 0 refills | Status: DC

## 2024-05-16 NOTE — Progress Notes (Signed)
 Office: [phone]  /  Fax: [phone]  WEIGHT SUMMARY AND BIOMETRICS  Weight Lost Since Last Visit: 0lb  Weight Gained Since Last Visit: 3lb   Vitals Temp: 98.6 F (37 C) BP: 122/82 Pulse Rate: 74 SpO2: 97 %   Anthropometric Measurements Height: 5' 3 (1.6 m) Weight: 194 lb (88 kg) BMI (Calculated): 34.37 Weight at Last Visit: 191lb Weight Lost Since Last Visit: 0lb Weight Gained Since Last Visit: 3lb Starting Weight: 175lb Total Weight Loss (lbs): 0 lb (0 kg)   Body Composition  Body Fat %: 32.1 % Fat Mass (lbs): 62.2 lbs Muscle Mass (lbs): 125.2 lbs Total Body Water (lbs): 93.4 lbs Visceral Fat Rating : 18   Other Clinical Data Fasting: No Labs: No Today's Visit #: 17 Starting Date: 01/12/22     HPI  Chief Complaint: OBESITY  Lorren is here to discuss his progress with his obesity treatment plan. He is on the keeping a food journal and adhering to recommended goals of 2000 calories and 100+ protein and states he is following his eating plan approximately 85 % of the time. He states he is exercising 0 minutes 0 days per week.   Interval History:  Since last office visit he has gained 3 pounds.  He notes he has gotten off track due to the holidays and helping his husband after recently having surgery.  His goal is to restart a 1500 calorie meal plan and start going back to the gym.    He is going to the beach next week.  His highest weight was 300lbs   Pharmacotherapy for weight loss: He is currently taking Wegovy  2.4 mg.  Denies side effects.  Notes polyphagia and cravings.      Previous pharmacotherapy for medical weight loss:  Previously tried Qsymia, phentermine , Wegovy , Victoza , Lomaira , Zepbound  and Saxenda  in the past for medical weight loss.    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 His highest weight prior to surgery was 300 lbs and his nadir weight after surgery was 159 lbs. He is  taking a MVI, Vit D 2,000 international units and is taking Vit B12.          PHYSICAL EXAM:  Blood pressure 122/82, pulse 74, temperature 98.6 F (37 C), height 5' 3 (1.6 m), weight 194 lb (88 kg), SpO2 97%. Body mass index is 34.37 kg/m.  General: He is overweight, cooperative, alert, well developed, and in no acute distress. PSYCH: Has normal mood, affect and thought process.   Extremities: No edema.  Neurologic: No gross sensory or motor deficits. No tremors or fasciculations noted.    DIAGNOSTIC DATA REVIEWED:  BMET    Component Value Date/Time   NA 138 07/20/2023 1618   NA 139 06/29/2023 0818   K 3.7 07/20/2023 1618   CL 104 07/20/2023 1618   CO2 26 07/20/2023 1618   GLUCOSE 123 (H) 07/20/2023 1618   BUN 20 07/20/2023 1618   BUN 14 06/29/2023 0818   CREATININE 0.95 07/20/2023 1618   CALCIUM  9.4 07/20/2023 1618   GFRNONAA >60 07/20/2023 1618   GFRAA >90 11/08/2011 1014   Lab Results  Component Value Date   HGBA1C 4.8 01/12/2022   HGBA1C 5.1 12/28/2008   Lab Results  Component Value Date   INSULIN  3.2 01/12/2022   Lab Results  Component Value Date   TSH 2.130 06/29/2023   CBC    Component Value Date/Time   WBC 6.8 07/20/2023 1618  RBC 5.21 07/20/2023 1618   HGB 15.7 07/20/2023 1618   HGB 16.0 06/29/2023 0818   HCT 45.2 07/20/2023 1618   HCT 48.6 06/29/2023 0818   PLT 257 07/20/2023 1618   PLT 234 06/29/2023 0818   MCV 86.8 07/20/2023 1618   MCV 91 06/29/2023 0818   MCH 30.1 07/20/2023 1618   MCHC 34.7 07/20/2023 1618   RDW 12.6 07/20/2023 1618   RDW 12.4 06/29/2023 0818   Iron Studies    Component Value Date/Time   FERRITIN 124 06/29/2023 0818   Lipid Panel     Component Value Date/Time   CHOL 200 (H) 06/29/2023 0818   TRIG 143 06/29/2023 0818   HDL 56 06/29/2023 0818   CHOLHDL 4 12/20/2013 0839   VLDL 31.6 12/20/2013 0839   LDLCALC 119 (H) 06/29/2023 0818   Hepatic Function Panel     Component Value Date/Time   PROT 7.6  07/20/2023 1618   PROT 7.0 06/29/2023 0818   ALBUMIN 4.3 07/20/2023 1618   ALBUMIN 4.4 06/29/2023 0818   AST 25 07/20/2023 1618   ALT 28 07/20/2023 1618   ALKPHOS 50 07/20/2023 1618   BILITOT 0.7 07/20/2023 1618   BILITOT 0.7 06/29/2023 0818   BILIDIR <0.1 07/20/2023 1618   IBILI NOT CALCULATED 07/20/2023 1618      Component Value Date/Time   TSH 2.130 06/29/2023 0818   Nutritional Lab Results  Component Value Date   VD25OH 35.0 06/29/2023   VD25OH 109.0 (H) 01/12/2022     ASSESSMENT AND PLAN  TREATMENT PLAN FOR OBESITY:  Recommended Dietary Goals  Brad Barrera is currently in the action stage of change. As such, his goal is to continue weight management plan. He has agreed to practicing portion control and making smarter food choices, such as increasing vegetables and decreasing simple carbohydrates.  Behavioral Intervention  We discussed the following Behavioral Modification Strategies today: increasing lean protein intake to established goals, decreasing simple carbohydrates , increasing vegetables, increasing fiber rich foods, increasing water intake , work on meal planning and preparation, work on tracking and journaling calories using tracking application, reading food labels , keeping healthy foods at home, celebration eating strategies, continue to work on maintaining a reduced calorie state, getting the recommended amount of protein, incorporating whole foods, making healthy choices, staying well hydrated and practicing mindfulness when eating., and increase protein intake, fibrous foods (25 grams per day for women, 30 grams for men) and water to improve satiety and decrease hunger signals. .  Additional resources provided today: NA  Recommended Physical Activity Goals  Grady has been advised to work up to 150 minutes of moderate intensity aerobic activity a week and strengthening exercises 2-3 times per week for cardiovascular health, weight loss maintenance and  preservation of muscle mass.   He has agreed to Think about enjoyable ways to increase daily physical activity and overcoming barriers to exercise, Increase physical activity in their day and reduce sedentary time (increase NEAT)., Start strengthening exercises with a goal of 2-3 sessions a week , Work on scheduling and tracking physical activity. , and Combine aerobic and strengthening exercises for efficiency and improved cardiometabolic health.   Pharmacotherapy We discussed various medication options to help Cameryn with his weight loss efforts and we both agreed to continue Wegovy  2.4mg .  side effects discussed.  ASSOCIATED CONDITIONS ADDRESSED TODAY  Action/Plan  Polyphagia -     Wegovy ; Inject 2.4 mg into the skin once a week.  Dispense: 3 mL; Refill: 0  Obesity, Class I,  BMI 30-34.9 -     Wegovy ; Inject 2.4 mg into the skin once a week.  Dispense: 3 mL; Refill: 0      Will obtain labs at next visit  Discussed referral to RD To let me know if he would like to proceed with the referral AI meal plan given to patient today   Return in about 6 weeks (around 06/27/2024).SABRA He was informed of the importance of frequent follow up visits to maximize his success with intensive lifestyle modifications for his multiple health conditions.   ATTESTASTION STATEMENTS:  Reviewed by clinician on day of visit: allergies, medications, problem list, medical history, surgical history, family history, social history, and previous encounter notes.     Corean SAUNDERS. [place] Yearby FNP-C

## 2024-05-29 ENCOUNTER — Other Ambulatory Visit (HOSPITAL_BASED_OUTPATIENT_CLINIC_OR_DEPARTMENT_OTHER): Payer: Self-pay

## 2024-06-05 ENCOUNTER — Other Ambulatory Visit (HOSPITAL_BASED_OUTPATIENT_CLINIC_OR_DEPARTMENT_OTHER): Payer: Self-pay

## 2024-06-05 DIAGNOSIS — I2510 Atherosclerotic heart disease of native coronary artery without angina pectoris: Secondary | ICD-10-CM | POA: Insufficient documentation

## 2024-06-05 MED ORDER — ASPIRIN 81 MG PO TBEC
81.0000 mg | DELAYED_RELEASE_TABLET | Freq: Every day | ORAL | 3 refills | Status: AC
  Filled 2024-06-05: qty 90, 90d supply, fill #0

## 2024-06-05 MED ORDER — ROSUVASTATIN CALCIUM 20 MG PO TABS: 20.0000 mg | ORAL_TABLET | Freq: Every day | ORAL | 3 refills | Status: AC

## 2024-06-05 MED ORDER — LOSARTAN POTASSIUM 50 MG PO TABS
50.0000 mg | ORAL_TABLET | Freq: Every day | ORAL | 3 refills | Status: AC
  Filled 2024-07-05 (×2): qty 90, 90d supply, fill #0
  Filled 2024-07-06: qty 30, 30d supply, fill #0

## 2024-06-05 MED ORDER — HYDROCHLOROTHIAZIDE 25 MG PO TABS
25.0000 mg | ORAL_TABLET | Freq: Every day | ORAL | 3 refills | Status: AC
  Filled 2024-06-05: qty 90, 90d supply, fill #0

## 2024-06-22 ENCOUNTER — Encounter: Payer: Self-pay | Admitting: Nurse Practitioner

## 2024-06-22 ENCOUNTER — Ambulatory Visit (INDEPENDENT_AMBULATORY_CARE_PROVIDER_SITE_OTHER): Admitting: Nurse Practitioner

## 2024-06-22 VITALS — BP 119/83 | HR 73 | Temp 98.0°F | Ht 63.0 in | Wt 196.0 lb

## 2024-06-22 DIAGNOSIS — E66811 Obesity, class 1: Secondary | ICD-10-CM

## 2024-06-22 DIAGNOSIS — K912 Postsurgical malabsorption, not elsewhere classified: Secondary | ICD-10-CM | POA: Diagnosis not present

## 2024-06-22 DIAGNOSIS — R632 Polyphagia: Secondary | ICD-10-CM | POA: Diagnosis not present

## 2024-06-22 DIAGNOSIS — Z6834 Body mass index (BMI) 34.0-34.9, adult: Secondary | ICD-10-CM

## 2024-06-22 MED ORDER — WEGOVY 2.4 MG/0.75ML ~~LOC~~ SOAJ: 2.4000 mg | SUBCUTANEOUS | 0 refills | Status: DC

## 2024-06-22 MED ORDER — WEGOVY 2.4 MG/0.75ML ~~LOC~~ SOAJ: 2.4000 mg | SUBCUTANEOUS | 0 refills | Status: AC

## 2024-06-22 NOTE — Progress Notes (Signed)
 "  Office: [phone]  /  Fax: [phone]  WEIGHT SUMMARY AND BIOMETRICS  Weight Lost Since Last Visit: 0lb  Weight Gained Since Last Visit: 2lb   Vitals Temp: 98 F (36.7 C) BP: 119/83 Pulse Rate: 73 SpO2: 98 %   Anthropometric Measurements Height: 5' 3 (1.6 m) Weight: 196 lb (88.9 kg) BMI (Calculated): 34.73 Weight at Last Visit: 194lb Weight Lost Since Last Visit: 0lb Weight Gained Since Last Visit: 2lb Starting Weight: 175lb Total Weight Loss (lbs): 0 lb (0 kg)   Body Composition  Body Fat %: 31.1 % Fat Mass (lbs): 61.2 lbs Muscle Mass (lbs): 128.6 lbs Total Body Water (lbs): 95.4 lbs Visceral Fat Rating : 17   Other Clinical Data Fasting: Yes Labs: No Today's Visit #: 18 Starting Date: 01/12/22     HPI  Chief Complaint: OBESITY  Brad Barrera is here to discuss his progress with his obesity treatment plan. He is on the keeping a food journal and adhering to recommended goals of 2000 calories and 100+ protein and states he is following his eating plan approximately 85 % of the time. He states he is exercising 30 minutes 2 days per week.   Interval History:  Since last office visit he has gained 2 pounds.  He is drinking water.  Denies sugary drinks.  He is walking daily.    His highest weight was 300lbs   Pharmacotherapy for weight loss: He is currently taking Wegovy  2.4 mg.  Denies side effects.  Notes polyphagia and cravings.      Previous pharmacotherapy for medical weight loss:  Previously tried Qsymia, phentermine , Wegovy , Victoza , Lomaira , Zepbound  and Saxenda  in the past for medical weight loss.    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 His highest weight prior to surgery was 300 lbs and his nadir weight after surgery was 159 lbs. He is taking a MVI, Vit D 2,000 international units and Vit B12.      PHYSICAL EXAM:  Blood pressure 119/83, pulse 73, temperature 98 F (36.7 C), height 5' 3  (1.6 m), weight 196 lb (88.9 kg), SpO2 98%. Body mass index is 34.72 kg/m.  General: He is overweight, cooperative, alert, well developed, and in no acute distress. PSYCH: Has normal mood, affect and thought process.   Extremities: No edema.  Neurologic: No gross sensory or motor deficits. No tremors or fasciculations noted.    DIAGNOSTIC DATA REVIEWED:  BMET    Component Value Date/Time   NA 138 07/20/2023 1618   NA 139 06/29/2023 0818   K 3.7 07/20/2023 1618   CL 104 07/20/2023 1618   CO2 26 07/20/2023 1618   GLUCOSE 123 (H) 07/20/2023 1618   BUN 20 07/20/2023 1618   BUN 14 06/29/2023 0818   CREATININE 0.95 07/20/2023 1618   CALCIUM  9.4 07/20/2023 1618   GFRNONAA >60 07/20/2023 1618   GFRAA >90 11/08/2011 1014   Lab Results  Component Value Date   HGBA1C 4.8 01/12/2022   HGBA1C 5.1 12/28/2008   Lab Results  Component Value Date   INSULIN  3.2 01/12/2022   Lab Results  Component Value Date   TSH 2.130 06/29/2023   CBC    Component Value Date/Time   WBC 6.8 07/20/2023 1618   RBC 5.21 07/20/2023 1618   HGB 15.7 07/20/2023 1618   HGB 16.0 06/29/2023 0818   HCT 45.2 07/20/2023 1618   HCT 48.6 06/29/2023 0818   PLT 257 07/20/2023 1618  PLT 234 06/29/2023 0818   MCV 86.8 07/20/2023 1618   MCV 91 06/29/2023 0818   MCH 30.1 07/20/2023 1618   MCHC 34.7 07/20/2023 1618   RDW 12.6 07/20/2023 1618   RDW 12.4 06/29/2023 0818   Iron Studies    Component Value Date/Time   FERRITIN 124 06/29/2023 0818   Lipid Panel     Component Value Date/Time   CHOL 200 (H) 06/29/2023 0818   TRIG 143 06/29/2023 0818   HDL 56 06/29/2023 0818   CHOLHDL 4 12/20/2013 0839   VLDL 31.6 12/20/2013 0839   LDLCALC 119 (H) 06/29/2023 0818   Hepatic Function Panel     Component Value Date/Time   PROT 7.6 07/20/2023 1618   PROT 7.0 06/29/2023 0818   ALBUMIN 4.3 07/20/2023 1618   ALBUMIN 4.4 06/29/2023 0818   AST 25 07/20/2023 1618   ALT 28 07/20/2023 1618   ALKPHOS 50  07/20/2023 1618   BILITOT 0.7 07/20/2023 1618   BILITOT 0.7 06/29/2023 0818   BILIDIR <0.1 07/20/2023 1618   IBILI NOT CALCULATED 07/20/2023 1618      Component Value Date/Time   TSH 2.130 06/29/2023 0818   Nutritional Lab Results  Component Value Date   VD25OH 35.0 06/29/2023   VD25OH 109.0 (H) 01/12/2022     ASSESSMENT AND PLAN  TREATMENT PLAN FOR OBESITY:  Recommended Dietary Goals  Brad Barrera is currently in the action stage of change. As such, his goal is to continue weight management plan. He has agreed to keeping a food journal and adhering to recommended goals of 1700-1800 calories and 90+ grams of protein.  Behavioral Intervention  We discussed the following Behavioral Modification Strategies today: increasing lean protein intake to established goals, decreasing simple carbohydrates , increasing vegetables, increasing fiber rich foods, increasing water intake , work on meal planning and preparation, work on tracking and journaling calories using tracking application, reading food labels , keeping healthy foods at home, planning for success, continue to work on maintaining a reduced calorie state, getting the recommended amount of protein, incorporating whole foods, making healthy choices, staying well hydrated and practicing mindfulness when eating., and increase protein intake, fibrous foods (25 grams per day for women, 30 grams for men) and water to improve satiety and decrease hunger signals. .  Additional resources provided today: NA  Recommended Physical Activity Goals  Brad Barrera has been advised to work up to 150 minutes of moderate intensity aerobic activity a week and strengthening exercises 2-3 times per week for cardiovascular health, weight loss maintenance and preservation of muscle mass.   He has agreed to Think about enjoyable ways to increase daily physical activity and overcoming barriers to exercise, Increase physical activity in their day and reduce sedentary  time (increase NEAT)., Continue to gradually increase the amount and intensity of exercise routine, Increase volume of physical activity to a goal of 240 minutes a week, and Combine aerobic and strengthening exercises for efficiency and improved cardiometabolic health.  Needs to add in cardio and resistance training  Pharmacotherapy We discussed various medication options to help Brad Barrera with his weight loss efforts and we both agreed to continue Wegovy  2.4mg .  side effects discussed.  ASSOCIATED CONDITIONS ADDRESSED TODAY  Action/Plan  Polyphagia -     Wegovy ; Inject 2.4 mg into the skin once a week.  Dispense: 9 mL; Refill: 0  Postoperative malabsorption -     Vitamin B1 -     Prealbumin -     Folate -  Ferritin -     Vitamin B12 -     VITAMIN D  25 Hydroxy (Vit-D Deficiency, Fractures) -     TSH -     Lipid Panel With LDL/HDL Ratio -     Comprehensive metabolic panel with GFR -     CBC with Differential/Platelet  Obesity, Class I, BMI 30-34.9 -     Wegovy ; Inject 2.4 mg into the skin once a week.  Dispense: 9 mL; Refill: 0       Return in about 3 months (around 09/20/2024).SABRA He was informed of the importance of frequent follow up visits to maximize his success with intensive lifestyle modifications for his multiple health conditions.   ATTESTASTION STATEMENTS:  Reviewed by clinician on day of visit: allergies, medications, problem list, medical history, surgical history, family history, social history, and previous encounter notes.     Corean SAUNDERS. Sherron Mapp FNP-C  "

## 2024-06-26 ENCOUNTER — Encounter: Payer: Self-pay | Admitting: Nurse Practitioner

## 2024-07-01 LAB — COMPREHENSIVE METABOLIC PANEL WITH GFR
ALT: 28 [IU]/L (ref 0–44)
AST: 25 [IU]/L (ref 0–40)
Albumin: 4.6 g/dL (ref 3.8–4.9)
Alkaline Phosphatase: 49 [IU]/L (ref 47–123)
BUN/Creatinine Ratio: 10 (ref 9–20)
BUN: 10 mg/dL (ref 6–24)
Bilirubin Total: 0.9 mg/dL (ref 0.0–1.2)
CO2: 24 mmol/L (ref 20–29)
Calcium: 9.4 mg/dL (ref 8.7–10.2)
Chloride: 100 mmol/L (ref 96–106)
Creatinine, Ser: 1.02 mg/dL (ref 0.76–1.27)
Globulin, Total: 2.6 g/dL (ref 1.5–4.5)
Glucose: 82 mg/dL (ref 70–99)
Potassium: 4.2 mmol/L (ref 3.5–5.2)
Sodium: 139 mmol/L (ref 134–144)
Total Protein: 7.2 g/dL (ref 6.0–8.5)
eGFR: 87 mL/min/{1.73_m2}

## 2024-07-01 LAB — LIPID PANEL WITH LDL/HDL RATIO
Cholesterol, Total: 107 mg/dL (ref 100–199)
HDL: 49 mg/dL
LDL Chol Calc (NIH): 44 mg/dL (ref 0–99)
LDL/HDL Ratio: 0.9 ratio (ref 0.0–3.6)
Triglycerides: 65 mg/dL (ref 0–149)
VLDL Cholesterol Cal: 14 mg/dL (ref 5–40)

## 2024-07-01 LAB — VITAMIN D 25 HYDROXY (VIT D DEFICIENCY, FRACTURES): Vit D, 25-Hydroxy: 47.9 ng/mL (ref 30.0–100.0)

## 2024-07-01 LAB — CBC WITH DIFFERENTIAL/PLATELET
Basophils Absolute: 0 10*3/uL (ref 0.0–0.2)
Basos: 1 %
EOS (ABSOLUTE): 0.1 10*3/uL (ref 0.0–0.4)
Eos: 1 %
Hematocrit: 52.6 % — ABNORMAL HIGH (ref 37.5–51.0)
Hemoglobin: 17.3 g/dL (ref 13.0–17.7)
Immature Grans (Abs): 0 10*3/uL (ref 0.0–0.1)
Immature Granulocytes: 0 %
Lymphocytes Absolute: 1.8 10*3/uL (ref 0.7–3.1)
Lymphs: 34 %
MCH: 31.4 pg (ref 26.6–33.0)
MCHC: 32.9 g/dL (ref 31.5–35.7)
MCV: 96 fL (ref 79–97)
Monocytes Absolute: 0.4 10*3/uL (ref 0.1–0.9)
Monocytes: 7 %
Neutrophils Absolute: 3 10*3/uL (ref 1.4–7.0)
Neutrophils: 57 %
Platelets: 239 10*3/uL (ref 150–450)
RBC: 5.51 x10E6/uL (ref 4.14–5.80)
RDW: 13.2 % (ref 11.6–15.4)
WBC: 5.3 10*3/uL (ref 3.4–10.8)

## 2024-07-01 LAB — TSH: TSH: 1.42 u[IU]/mL (ref 0.450–4.500)

## 2024-07-01 LAB — PREALBUMIN: PREALBUMIN: 28 mg/dL (ref 10–36)

## 2024-07-01 LAB — VITAMIN B1: Thiamine: 162 nmol/L (ref 66.5–200.0)

## 2024-07-01 LAB — FOLATE: Folate: >20 ng/mL

## 2024-07-01 LAB — VITAMIN B12: Vitamin B-12: >2000 pg/mL — ABNORMAL HIGH (ref 232–1245)

## 2024-07-01 LAB — FERRITIN: Ferritin: 63 ng/mL (ref 30–400)

## 2024-07-02 ENCOUNTER — Ambulatory Visit: Payer: Self-pay | Admitting: Nurse Practitioner

## 2024-07-05 ENCOUNTER — Other Ambulatory Visit (HOSPITAL_BASED_OUTPATIENT_CLINIC_OR_DEPARTMENT_OTHER): Payer: Self-pay

## 2024-07-06 ENCOUNTER — Other Ambulatory Visit (HOSPITAL_BASED_OUTPATIENT_CLINIC_OR_DEPARTMENT_OTHER): Payer: Self-pay

## 2024-09-28 ENCOUNTER — Ambulatory Visit: Admitting: Nurse Practitioner
